# Patient Record
Sex: Female | Born: 1960 | Race: Black or African American | Hispanic: No | Marital: Married | Smoking: Never smoker
Health system: Southern US, Community
[De-identification: ages and names within clinical notes are randomized; demographics above are authoritative.]

## PROBLEM LIST (undated history)

## (undated) DIAGNOSIS — C801 Malignant (primary) neoplasm, unspecified: Secondary | ICD-10-CM

## (undated) DIAGNOSIS — E119 Type 2 diabetes mellitus without complications: Secondary | ICD-10-CM

## (undated) DIAGNOSIS — R51 Headache: Secondary | ICD-10-CM

## (undated) DIAGNOSIS — R519 Headache, unspecified: Secondary | ICD-10-CM

## (undated) DIAGNOSIS — E7800 Pure hypercholesterolemia, unspecified: Secondary | ICD-10-CM

## (undated) DIAGNOSIS — K219 Gastro-esophageal reflux disease without esophagitis: Secondary | ICD-10-CM

## (undated) DIAGNOSIS — T7840XA Allergy, unspecified, initial encounter: Secondary | ICD-10-CM

## (undated) DIAGNOSIS — T8859XA Other complications of anesthesia, initial encounter: Secondary | ICD-10-CM

## (undated) DIAGNOSIS — J45909 Unspecified asthma, uncomplicated: Secondary | ICD-10-CM

## (undated) DIAGNOSIS — R011 Cardiac murmur, unspecified: Secondary | ICD-10-CM

## (undated) DIAGNOSIS — Z8619 Personal history of other infectious and parasitic diseases: Secondary | ICD-10-CM

## (undated) HISTORY — DX: Pure hypercholesterolemia, unspecified: E78.00

## (undated) HISTORY — PX: ROTATOR CUFF REPAIR: SHX139

## (undated) HISTORY — PX: WISDOM TOOTH EXTRACTION: SHX21

## (undated) HISTORY — DX: Unspecified asthma, uncomplicated: J45.909

## (undated) HISTORY — DX: Allergy, unspecified, initial encounter: T78.40XA

## (undated) HISTORY — DX: Type 2 diabetes mellitus without complications: E11.9

## (undated) HISTORY — DX: Gastro-esophageal reflux disease without esophagitis: K21.9

---
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
## 1994-02-08 HISTORY — PX: BREAST ENHANCEMENT SURGERY: SHX7

## 1994-02-08 HISTORY — PX: AUGMENTATION MAMMAPLASTY: SUR837

## 1995-02-09 HISTORY — PX: BREAST EXCISIONAL BIOPSY: SUR124

## 2001-02-08 HISTORY — PX: BREAST CYST ASPIRATION: SHX578

## 2005-02-08 HISTORY — PX: OTHER SURGICAL HISTORY: SHX169

## 2006-02-08 HISTORY — PX: TONSILLECTOMY AND ADENOIDECTOMY: SUR1326

## 2007-02-09 HISTORY — PX: ABDOMINAL HYSTERECTOMY: SHX81

## 2007-02-09 HISTORY — PX: OVARIAN CYST REMOVAL: SHX89

## 2009-11-03 ENCOUNTER — Ambulatory Visit: Payer: Self-pay | Admitting: Family Medicine

## 2009-11-14 ENCOUNTER — Ambulatory Visit: Payer: Self-pay | Admitting: General Practice

## 2010-02-27 ENCOUNTER — Emergency Department: Payer: Self-pay | Admitting: Unknown Physician Specialty

## 2011-01-11 ENCOUNTER — Ambulatory Visit: Payer: Self-pay | Admitting: Family Medicine

## 2011-05-27 ENCOUNTER — Other Ambulatory Visit: Payer: Self-pay | Admitting: Family Medicine

## 2011-07-23 ENCOUNTER — Other Ambulatory Visit: Payer: Self-pay | Admitting: General Practice

## 2012-07-22 ENCOUNTER — Ambulatory Visit: Payer: Self-pay | Admitting: Neurology

## 2012-12-25 ENCOUNTER — Other Ambulatory Visit: Payer: Self-pay | Admitting: Urgent Care

## 2012-12-25 LAB — COMPREHENSIVE METABOLIC PANEL
Alkaline Phosphatase: 79 U/L (ref 50–136)
Anion Gap: 8 (ref 7–16)
BUN: 11 mg/dL (ref 7–18)
Bilirubin,Total: 0.4 mg/dL (ref 0.2–1.0)
Calcium, Total: 9.2 mg/dL (ref 8.5–10.1)
EGFR (African American): >60
EGFR (Non-African Amer.): >60
Sodium: 137 mmol/L (ref 136–145)

## 2012-12-25 LAB — CBC WITH DIFFERENTIAL/PLATELET
Basophil #: 0.1 10*3/uL (ref 0.0–0.1)
Eosinophil #: 0.1 10*3/uL (ref 0.0–0.7)
Eosinophil %: 1.5 %
HCT: 44.6 % (ref 35.0–47.0)
HGB: 15.4 g/dL (ref 12.0–16.0)
Lymphocyte %: 22.8 %
MCHC: 34.4 g/dL (ref 32.0–36.0)
Monocyte #: 0.6 x10 3/mm (ref 0.2–0.9)
Neutrophil #: 6.8 10*3/uL — ABNORMAL HIGH (ref 1.4–6.5)
Platelet: 513 10*3/uL — ABNORMAL HIGH (ref 150–440)
RBC: 4.9 10*6/uL (ref 3.80–5.20)
WBC: 10 10*3/uL (ref 3.6–11.0)

## 2012-12-28 ENCOUNTER — Ambulatory Visit: Payer: Self-pay | Admitting: Gastroenterology

## 2012-12-29 LAB — PATHOLOGY REPORT

## 2013-01-17 ENCOUNTER — Ambulatory Visit: Payer: Self-pay | Admitting: Family Medicine

## 2013-02-13 ENCOUNTER — Other Ambulatory Visit: Payer: Self-pay | Admitting: Urgent Care

## 2013-02-13 LAB — HEPATIC FUNCTION PANEL A (ARMC)
Albumin: 3.5 g/dL (ref 3.4–5.0)
Alkaline Phosphatase: 64 U/L
Bilirubin, Direct: 0.1 mg/dL (ref 0.00–0.20)
Bilirubin,Total: 0.2 mg/dL (ref 0.2–1.0)
SGOT(AST): 30 U/L (ref 15–37)
SGPT (ALT): 39 U/L (ref 12–78)
TOTAL PROTEIN: 7.3 g/dL (ref 6.4–8.2)

## 2013-03-06 ENCOUNTER — Ambulatory Visit: Payer: Self-pay | Admitting: Gastroenterology

## 2013-05-03 ENCOUNTER — Ambulatory Visit: Payer: Self-pay | Admitting: Specialist

## 2013-05-03 LAB — CBC WITH DIFFERENTIAL/PLATELET
BASOS ABS: 0.1 10*3/uL (ref 0.0–0.1)
Basophil %: 0.6 %
EOS ABS: 0.1 10*3/uL (ref 0.0–0.7)
EOS PCT: 1.4 %
HCT: 42.8 % (ref 35.0–47.0)
HGB: 14.6 g/dL (ref 12.0–16.0)
LYMPHS ABS: 2.6 10*3/uL (ref 1.0–3.6)
LYMPHS PCT: 25.9 %
MCH: 31.4 pg (ref 26.0–34.0)
MCHC: 34.1 g/dL (ref 32.0–36.0)
MCV: 92 fL (ref 80–100)
Monocyte #: 0.7 x10 3/mm (ref 0.2–0.9)
Monocyte %: 7.3 %
NEUTROS ABS: 6.6 10*3/uL — AB (ref 1.4–6.5)
Neutrophil %: 64.8 %
Platelet: 434 10*3/uL (ref 150–440)
RBC: 4.64 10*6/uL (ref 3.80–5.20)
RDW: 13.2 % (ref 11.5–14.5)
WBC: 10.1 10*3/uL (ref 3.6–11.0)

## 2013-07-03 DIAGNOSIS — J45909 Unspecified asthma, uncomplicated: Secondary | ICD-10-CM | POA: Insufficient documentation

## 2013-07-27 ENCOUNTER — Inpatient Hospital Stay: Payer: Self-pay | Admitting: Family Medicine

## 2013-07-27 LAB — CBC
HCT: 45.3 % (ref 35.0–47.0)
HGB: 14.9 g/dL (ref 12.0–16.0)
MCH: 30.8 pg (ref 26.0–34.0)
MCHC: 32.8 g/dL (ref 32.0–36.0)
MCV: 94 fL (ref 80–100)
Platelet: 393 10*3/uL (ref 150–440)
RBC: 4.84 10*6/uL (ref 3.80–5.20)
RDW: 12.5 % (ref 11.5–14.5)
WBC: 8 10*3/uL (ref 3.6–11.0)

## 2013-07-27 LAB — COMPREHENSIVE METABOLIC PANEL
ALK PHOS: 114 U/L
ALT: 55 U/L (ref 12–78)
ANION GAP: 13 (ref 7–16)
AST: 71 U/L — AB (ref 15–37)
Albumin: 3.7 g/dL (ref 3.4–5.0)
BUN: 9 mg/dL (ref 7–18)
Bilirubin,Total: 0.3 mg/dL (ref 0.2–1.0)
Calcium, Total: 9.2 mg/dL (ref 8.5–10.1)
Chloride: 95 mmol/L — ABNORMAL LOW (ref 98–107)
Co2: 20 mmol/L — ABNORMAL LOW (ref 21–32)
Creatinine: 1.03 mg/dL (ref 0.60–1.30)
EGFR (African American): >60
EGFR (Non-African Amer.): >60
GLUCOSE: 589 mg/dL — AB (ref 65–99)
Potassium: 3.7 mmol/L (ref 3.5–5.1)
Sodium: 128 mmol/L — ABNORMAL LOW (ref 136–145)
Total Protein: 7.5 g/dL (ref 6.4–8.2)

## 2013-07-27 LAB — URINALYSIS, COMPLETE
BILIRUBIN, UR: NEGATIVE
Bacteria: NONE SEEN
Glucose,UR: >=500 mg/dL (ref 0–75)
LEUKOCYTE ESTERASE: NEGATIVE
Nitrite: NEGATIVE
PROTEIN: NEGATIVE
Ph: 5 (ref 4.5–8.0)
RBC,UR: 2 /HPF (ref 0–5)
Specific Gravity: 1.031 (ref 1.003–1.030)
Squamous Epithelial: 1
WBC UR: 1 /HPF (ref 0–5)

## 2013-07-27 LAB — HEMOGLOBIN A1C: Hemoglobin A1C: 11.8 % — ABNORMAL HIGH (ref 4.2–6.3)

## 2013-07-27 LAB — LIPASE, BLOOD: Lipase: 147 U/L (ref 73–393)

## 2013-07-28 LAB — BASIC METABOLIC PANEL
Anion Gap: 8 (ref 7–16)
BUN: 5 mg/dL — ABNORMAL LOW (ref 7–18)
CREATININE: 0.58 mg/dL — AB (ref 0.60–1.30)
Calcium, Total: 7.7 mg/dL — ABNORMAL LOW (ref 8.5–10.1)
Chloride: 108 mmol/L — ABNORMAL HIGH (ref 98–107)
Co2: 21 mmol/L (ref 21–32)
EGFR (African American): >60
Glucose: 133 mg/dL — ABNORMAL HIGH (ref 65–99)
OSMOLALITY: 273 (ref 275–301)
Potassium: 3.1 mmol/L — ABNORMAL LOW (ref 3.5–5.1)
SODIUM: 137 mmol/L (ref 136–145)

## 2013-07-29 LAB — BASIC METABOLIC PANEL
ANION GAP: 11 (ref 7–16)
BUN: 6 mg/dL — AB (ref 7–18)
CO2: 21 mmol/L (ref 21–32)
CREATININE: 0.65 mg/dL (ref 0.60–1.30)
Calcium, Total: 8.7 mg/dL (ref 8.5–10.1)
Chloride: 105 mmol/L (ref 98–107)
EGFR (Non-African Amer.): >60
Glucose: 262 mg/dL — ABNORMAL HIGH (ref 65–99)
Osmolality: 281 (ref 275–301)
Potassium: 4.5 mmol/L (ref 3.5–5.1)
Sodium: 137 mmol/L (ref 136–145)

## 2013-07-30 LAB — BASIC METABOLIC PANEL
Anion Gap: 9 (ref 7–16)
BUN: 8 mg/dL (ref 7–18)
CO2: 21 mmol/L (ref 21–32)
CREATININE: 0.64 mg/dL (ref 0.60–1.30)
Calcium, Total: 9.5 mg/dL (ref 8.5–10.1)
Chloride: 105 mmol/L (ref 98–107)
GLUCOSE: 247 mg/dL — AB (ref 65–99)
OSMOLALITY: 277 (ref 275–301)
POTASSIUM: 3.9 mmol/L (ref 3.5–5.1)
SODIUM: 135 mmol/L — AB (ref 136–145)

## 2013-08-03 ENCOUNTER — Ambulatory Visit: Payer: Self-pay | Admitting: Family Medicine

## 2013-08-03 DIAGNOSIS — E119 Type 2 diabetes mellitus without complications: Secondary | ICD-10-CM | POA: Insufficient documentation

## 2013-08-03 DIAGNOSIS — G43909 Migraine, unspecified, not intractable, without status migrainosus: Secondary | ICD-10-CM | POA: Insufficient documentation

## 2013-08-08 ENCOUNTER — Ambulatory Visit: Payer: Self-pay | Admitting: Family Medicine

## 2013-09-08 ENCOUNTER — Ambulatory Visit: Payer: Self-pay | Admitting: Family Medicine

## 2013-12-25 DIAGNOSIS — K219 Gastro-esophageal reflux disease without esophagitis: Secondary | ICD-10-CM | POA: Insufficient documentation

## 2014-03-19 ENCOUNTER — Ambulatory Visit: Payer: Self-pay | Admitting: Gastroenterology

## 2014-04-15 ENCOUNTER — Emergency Department: Payer: Self-pay | Admitting: Emergency Medicine

## 2014-05-22 ENCOUNTER — Ambulatory Visit: Payer: Self-pay

## 2014-06-01 NOTE — H&P (Signed)
PATIENT NAME:  Virginia Rogers, Virginia Rogers MR#:  [id number] DATE OF BIRTH:  November 08, 1960  DATE OF ADMISSION:  07/27/2013  PRIMARY CARE PHYSICIAN: Dr. Netty Starring.   CHIEF COMPLAINT: Elevated blood sugar.   HISTORY OF PRESENT ILLNESS: This is a very pleasant 54 year old female who actually works at [employer], who presented to her primary care physician's office today complaining of polyuria, polydipsia, weight loss, and leg cramps. She went in for some routine labs and they did obviously a BMP and glucose. She got a call this evening saying that she had a critical value of glucose greater than 400. Sure enough when she arrived in the ER, her sugar/blood glucose was greater than 600. She was given only 6 units of insulin and 2 liters of fluids. Her blood sugars remain about 589. Over the past few months, the patient has had weight loss. She has been having polyuria, polydipsia. She thought this was related to the hot weather.   REVIEW OF SYSTEMS:   CONSTITUTIONAL: No fever, fatigue, weakness.  EYES: No blurred or double vision. No glaucoma or cataracts.  EARS, NOSE, THROAT: No ear pain, seasonal allergies, snoring, postnasal drip.  RESPIRATORY: No cough, wheezing, hemoptysis, COPD.  CARDIOVASCULAR: No chest pain. No palpitations, syncope, edema, arrhythmia, dyspnea on exertion.  GASTROINTESTINAL: No nausea, vomiting, diarrhea. She occasionally has some abdominal cramping in the right lower quadrant. No melena or ulcers.  GENITOURINARY: No dysuria or hematuria.  ENDOCRINE: Positive polyuria, polydipsia, polyphagia. HEMATOLOGIC AND LYMPHATIC: No anemia or easy bruising.  SKIN: No rashes or lesions.  MUSCULOSKELETAL: No limited activity.  NEUROLOGIC: No history of CVA, TIA, or seizures.  PSYCHIATRIC: No history of anxiety or depression.   PAST MEDICAL HISTORY: Migraines, also asthma.  MEDICATIONS:  1.  Imitrex p.r.n.  2.  Birth control pills.  ALLERGIES:  1.  PENICILLIN AND VANCOMYCIN CAUSE  ANAPHYLAXIS.  2.  LOBSTER.  SOCIAL HISTORY: No tobacco, alcohol, or IV drug use. She lives with her husband.   PAST SURGICAL HISTORY: 1.  Hysterectomy.  2.  Breast lumpectomy, subsequently had bilateral breast implants.  3.  Bilateral rotator cuff.   FAMILY HISTORY: Positive for diabetes and CAD at a young age.   PHYSICAL EXAMINATION:  VITAL SIGNS: Temperature 98.3, pulse 95, respirations 19, blood pressure 180/87, 97% on room air.  GENERAL: The patient is alert and oriented, not in acute distress. HEENT: Head is atraumatic. Pupils are round and reactive. Sclerae anicteric. Mucous membranes are moist. Oropharynx is clear.  NECK: Supple without JVD, carotid bruit, or enlarged thyroid.  CARDIOVASCULAR: Regular rate and rhythm. No murmur, gallops, or rubs. PMI is not displaced.  LUNGS: Clear to auscultation without crackles, rales, rhonchi, or wheezing. Normal to percussion.  ABDOMEN: Bowel sounds are positive. Nontender, nondistended. No hepatosplenomegaly. EXTREMITIES: No clubbing, cyanosis, or edema.  NEUROLOGIC: Cranial nerves II through XII are intact. No focal deficits. SKIN: Without rashes or lesions. MUSCULOSKELETAL: The patient is able to move all extremities, 5 out of 5 strength.   LABORATORY DATA: White blood cells 8, hemoglobin 15, hematocrit 45.3, platelets of 393. Lipase 147. Sodium 128, potassium 3.7, chloride 95, bicarbonate 20, BUN 9, creatinine 1.03,  glucose 589, calcium 9.2, bilirubin 0.3, alkaline phosphatase 114, ALT 55, AST 71, total protein 7.5, albumin 3.7, anion gap of 13. Urinalysis is negative for LCE or nitrites. PCO2 is 33.   ASSESSMENT AND PLAN: This is a very pleasant 54 year old female with history of migraines, asthma, who presents with new-onset diabetes.  1.  Diabetes, not diabetic  ketoacidosis: The patient will be admitted to stepdown with a non-diabetic ketoacidosis hyperglycemic protocol. We will provide ample amount of fluids, as secondary to her  elevated blood sugars. Check a hemoglobin A1c. The patient will need outpatient diabetes education, which can be planned for Monday. Continue to monitor BMPs and blood sugars.  2.  Hyponatremia secondary to her hyperglycemia: This corrects with her elevated blood sugar. Will follow.  3.  Accelerated blood pressure: Likely secondary to anxiety. The patient says that she has no history of high blood pressure. We will continue to follow while in the hospital. No medications at this time.  4.  The patient is full code status.   TIME SPENT: Approximately 45 minutes.   ____________________________ Donell Beers. Benjie Karvonen, MD spm:jcm D: 07/27/2013 20:25:56 ET T: 07/27/2013 21:27:01 ET JOB#: [id number]  cc: Sital P. Benjie Karvonen, MD, <Dictator> Dion Body, MD Donell Beers MODY MD ELECTRONICALLY SIGNED 07/28/2013 23:30

## 2014-06-01 NOTE — Discharge Summary (Signed)
PATIENT NAME:  Virginia Rogers, Virginia Rogers MR#:  [id number] DATE OF BIRTH:  1961-01-06  DATE OF ADMISSION:  07/27/2013 DATE OF DISCHARGE:    DISCHARGE DIAGNOSES: 1.  Adult-onset diabetes that is new.  2.  Migraines.    DISCHARGE MEDICATIONS: 1.  Imitrex 100 mg p.o. p.r.n. up to 2 tabs four hours apart as needed for migraines.  2.  Nasonex 50 mcg once per each nostril daily as needed.  3.  Nexium 40 mg p.o. daily. 4.  Metformin 500 mg p.o. b.i.d. to be taken with meals. 5.  Glimepiride 2 mg p.o. b.i.d. to be taken with meals. 6.  Aspirin 81 mg p.o. daily.  7.  Levonorgestrel ethinyl estradiol tablet 0.1/20, 1 tab daily as directed.    CONSULTS: None.   PROCEDURES: None.  PERTINENT LABS: On day of discharge, sodium 135, potassium 3.9, creatinine 0.64, glucose 247. A1c of 11.8%. C-peptide is still pending.   BRIEF HOSPITAL COURSE: The patient initially came in with elevated CBGs, hyperglycemia without diabetic ketoacidosis, new onset diabetes with A1c 11.8%. She was initially placed on an insulin drip and was titrated off promptly. She was switched over to oral metformin and glimepiride with goal to keep her off of insulin at this time. C-peptide was ordered but still pending. Based on that level and based on her CBGs, we will determine at later time whether or not to add insulin to her regimen. She was educated from a lifestyle modification standpoint. She is to check her CBGs once a day fasting, follow up with Dr. Netty Starring in one week in the clinic.   ____________________________ Dion Body, MD kl:cg D: 07/30/2013 07:40:51 ET T: 07/30/2013 07:47:26 ET JOB#: [id number]  cc: Dion Body, MD, <Dictator> Dion Body MD ELECTRONICALLY SIGNED 08/07/2013 11:33

## 2014-06-03 ENCOUNTER — Other Ambulatory Visit: Payer: Self-pay

## 2014-06-03 LAB — SURGICAL PATHOLOGY

## 2014-06-03 NOTE — Patient Outreach (Signed)
[name]  06/03/2014   Virginia Rogers September 09, 1960 [id number]   Objective: Blood sugar meter and tracking notes show excellent fasting blood sugars of 90-148m/dl in early March but now fasting blood sugars of 130's since starting Symbicort for asthma flare. She is done the Symbicort now.   Her own dog bit her finger - had to take antibiotics and her blood sugars were lower at that time.  Current Medications:  Current Outpatient Prescriptions  Medication Sig Dispense Refill  . EPINEPHrine 1 MG/ML KIT Inject 0.3 mg as directed as needed. Take 0.370monce a day as needed    . esomeprazole (NEXIUM) 40 MG capsule Take 20 mg by mouth every morning.     . fexofenadine (ALLEGRA) 180 MG tablet Take 180 mg by mouth daily.    . Marland Kitchenlimepiride (AMARYL) 1 MG tablet Take 1 mg by mouth 2 (two) times daily.    . SUMAtriptan (IMITREX) 100 MG tablet Take 100 mg by mouth as needed. Repeat after 4 hours if needed, no more than 2 tabs per 24 hours     No current facility-administered medications for this visit.     Fall/Depression Screening: PHQ 2/9 Scores 06/03/2014  PHQ - 2 Score 0    Assessment: Excellent grasp of diabetes knowledge- eating meals as instructed in diabetes classes. Recent flare with asthma has caused sugars to be elevated but had been doing well previous to that.   [facility] Care Plan Problem One        Patient Outreach from 06/03/2014 in [place]  Patient will increase exercise as tolerated to avoid asthma exacerbations and achive exercise 5x/week for 30 minutes by august 2016   CaHallsvilleor Problem One  Active   [facility] Goal Start Date  06/03/14   Interventions for Problem One Long Term Goal  Ride tricycle or use WII if weather is too hot, rainy or asthma symptoms are present      Follow up with Dr. FlRaul Deln July, 2016, Dr. LiHassan Rowanugust 2016   JuGentry FitzRN, BAPhilippiMHBancroft CDMadison [phone]:  [phone]-mail: [email or web address] [address]Bu[address]amplico[place]  [id number]

## 2014-07-24 ENCOUNTER — Telehealth: Payer: Self-pay

## 2014-07-30 ENCOUNTER — Telehealth: Payer: Self-pay

## 2014-08-19 ENCOUNTER — Other Ambulatory Visit: Payer: Self-pay | Admitting: Physician Assistant

## 2014-08-19 ENCOUNTER — Telehealth: Payer: Self-pay

## 2014-08-19 DIAGNOSIS — N643 Galactorrhea not associated with childbirth: Secondary | ICD-10-CM

## 2014-08-20 ENCOUNTER — Other Ambulatory Visit: Payer: Self-pay | Admitting: Physician Assistant

## 2014-08-20 DIAGNOSIS — N643 Galactorrhea not associated with childbirth: Secondary | ICD-10-CM

## 2014-08-23 ENCOUNTER — Ambulatory Visit
Admission: RE | Admit: 2014-08-23 | Discharge: 2014-08-23 | Disposition: A | Discharge status: Home / Self Care | Payer: 59 | Source: Ambulatory Visit | Attending: Physician Assistant | Admitting: Physician Assistant

## 2014-08-23 ENCOUNTER — Ambulatory Visit: Payer: 59

## 2014-08-23 ENCOUNTER — Other Ambulatory Visit: Payer: Self-pay | Admitting: Physician Assistant

## 2014-08-23 DIAGNOSIS — N643 Galactorrhea not associated with childbirth: Secondary | ICD-10-CM

## 2014-08-23 DIAGNOSIS — Z9882 Breast implant status: Secondary | ICD-10-CM | POA: Diagnosis not present

## 2014-08-23 DIAGNOSIS — N6002 Solitary cyst of left breast: Secondary | ICD-10-CM | POA: Diagnosis not present

## 2014-08-23 HISTORY — DX: Malignant (primary) neoplasm, unspecified: C80.1

## 2014-09-25 ENCOUNTER — Other Ambulatory Visit: Payer: Self-pay

## 2014-09-25 NOTE — Patient Outreach (Signed)
[facility]) Care Management  09/25/2014  Virginia Rogers Feb 04, 1961 [id number]   Left message for patient to call me.  Ran into her a couple of weeks ago and she was very concerned about high blood sugars- told me she'd try to get into see her MD and then get back with me but I haven't heard from her.    [name], RN, BA, [place], [place] Direct Dial:  [phone]  Fax:  [phone] E-mail: [email or web address] [address] [place], [place]  [address]

## 2014-09-27 ENCOUNTER — Other Ambulatory Visit: Payer: Self-pay

## 2014-09-27 NOTE — Patient Outreach (Signed)
[facility]) Care Management  09/27/2014  Anina Schnake 07/01/1960 [id number]   Spoke to [place] by phone regarding recent high blood sugars.  She spoke to Dr. Netty Starring who started her on Lantus 10 units daily- she continues on Glimiperide 4mg  bid as well.  Fasting blood sugars have improved but are still 167- 240mg /dl.   I have suggested she try and have a bedtime snack containing a protein and carbohydrate to see if we can get the fasting blood sugars to improve.  Patient eats supper around 6pm and goes to bed near midnight and has previously not eaten a snack.  She will call me in a week.   [name], RN, BA, [place], [place] Direct Dial:  [phone]  Fax:  [phone] E-mail: [email or web address] [address], [place], [place]  [address]

## 2014-10-02 ENCOUNTER — Other Ambulatory Visit: Payer: Self-pay

## 2014-10-07 NOTE — Patient Outreach (Signed)
[place] [facility]) Care Management  10/07/2014  Wendy Hoback 01-Jan-1961 [id number]  Ran into [place] at a hospital wide screening.  She reports that since she has added a bedtime snack to her evenings, blood sugars have dropped to the 150's fasting each day.  She continues on diabetes medications as ordered.   [name], RN, BA, [place], [place] Direct Dial:  [phone]  Fax:  [phone] E-mail: [email or web address] [address] [place], [place]  [address]

## 2014-11-15 ENCOUNTER — Telehealth: Payer: Self-pay

## 2014-11-21 ENCOUNTER — Telehealth: Payer: Self-pay

## 2014-11-22 ENCOUNTER — Encounter: Payer: Self-pay | Admitting: Physician Assistant

## 2014-11-22 ENCOUNTER — Ambulatory Visit: Payer: Self-pay | Admitting: Physician Assistant

## 2014-11-22 VITALS — BP 142/80 | Temp 99.0°F

## 2014-11-22 DIAGNOSIS — R05 Cough: Secondary | ICD-10-CM

## 2014-11-22 DIAGNOSIS — R059 Cough, unspecified: Secondary | ICD-10-CM

## 2014-11-22 MED ORDER — HYDROCOD POLST-CPM POLST ER 10-8 MG/5ML PO SUER: 5.0000 mL | Freq: Two times a day (BID) | ORAL | Status: DC

## 2014-11-22 MED ORDER — BENZONATATE 200 MG PO CAPS: 200.0000 mg | ORAL_CAPSULE | Freq: Two times a day (BID) | ORAL | Status: DC | PRN

## 2014-11-22 NOTE — Progress Notes (Signed)
   Subjective:    Patient ID: Wakeelah Solan, female    DOB: 21-Jun-1960, 54 y.o.   MRN: [id number]  HPI Patient c/o two week of intermitting productive/non-productive cough.  Seen last week and give Z-pack and Prednisone. Cough has persist and worsen.      Review of Systems Denies fever/chill.  Intermitting vomiting 2nd to prolong cough episode.     Objective:   Physical Exam Appears malaise. HEENT unremarkable, except for prolong cough.  Neck supple, Lungs CTA, and Heart RRR.       Assessment & Plan:  Bronchospasms.  Tessalon and Tussionex as directed.  Follow up in 3 days.

## 2014-12-04 ENCOUNTER — Telehealth: Payer: Self-pay

## 2015-01-08 ENCOUNTER — Other Ambulatory Visit: Payer: Self-pay

## 2015-01-08 NOTE — Patient Outreach (Signed)
[place] [facility] [facility]) Care Management  01/08/2015  Virginia Rogers 1961-01-06 [id number]  Spoke to patient by phone.  Her blood sugars have gone from 270-290mg /dl in the morning to 130-142mg /dl fasting but she's having blood sugars 79-80mg /dl around 3:30pm.  She is eating lunch.  She is not shaky when this is happening but she is developing a headache. She is taking Invokana 100mg /day and Glimiperide 2mg  qam and 2mg  qpm.  She is going to try and take 1/2 tab of Glimiperide in the am and assess her blood sugar and headaches to see if they improve.  She will follow up with me in 3 weeks.    Virginia Fitz, RN, BA, [place], [place] Direct Dial:  [phone]  Fax:  [phone] E-mail: [email or web address] [address], [place], [place]  [address]

## 2015-01-20 ENCOUNTER — Ambulatory Visit: Payer: Self-pay

## 2015-01-27 ENCOUNTER — Other Ambulatory Visit: Payer: Self-pay

## 2015-01-27 VITALS — BP 130/84 | Ht 65.0 in | Wt 226.0 lb

## 2015-01-27 DIAGNOSIS — E119 Type 2 diabetes mellitus without complications: Secondary | ICD-10-CM | POA: Insufficient documentation

## 2015-01-27 NOTE — Patient Outreach (Signed)
[place] [facility]) Care Management  [place]  01/27/2015   Verma Grothaus 1961/01/30 [id number]  Subjective: Izzah was in for her Link to Wellness visit- she is actively working with her MD to managing her blood sugars since she has been on numerous asthma medications and steroids this fall. She currently has well controlled blood sugars and is looking forward to a repeat A1C in February when she sees her MD.  Started exercising Saturday, January 25, 2015 because her asthma was controlled.  Yesterday was too hot and hshe broke out with a heat rash and was unable to exercise. Will resume tonight.   Objective:  Filed Vitals:   01/27/15 1542  BP: 130/84  Height: 1.651 m (_0 )  Weight: 226 lb (102.513 kg)     Current Medications:  Current Outpatient Prescriptions  Medication Sig Dispense Refill  . benzonatate (TESSALON) 200 MG capsule Take 1 capsule (200 mg total) by mouth 2 (two) times daily as needed for cough. 20 capsule 0  . budesonide-formoterol (SYMBICORT) 160-4.5 MCG/ACT inhaler Inhale 2 puffs into the lungs daily after breakfast.    . EPINEPHrine 1 MG/ML KIT Inject 0.3 mg as directed as needed. Take 0.38m once a day as needed    . esomeprazole (NEXIUM) 40 MG capsule Take 20 mg by mouth every other day.     .Marland Kitchenglimepiride (AMARYL) 1 MG tablet Take 4 mg by mouth 2 (two) times daily.     .Marland Kitchenloratadine (CLARITIN) 10 MG tablet Take 10 mg by mouth daily.    .Marland Kitchenlosartan (COZAAR) 25 MG tablet Take 25 mg by mouth at bedtime.    . SUMAtriptan (IMITREX) 100 MG tablet Take 100 mg by mouth as needed. Repeat after 4 hours if needed, no more than 2 tabs per 24 hours    . chlorpheniramine-HYDROcodone (TUSSIONEX PENNKINETIC ER) 10-8 MG/5ML SUER Take 5 mLs by mouth 2 (two) times daily. (Patient not taking: Reported on 01/27/2015) 115 mL 0  . fexofenadine (ALLEGRA) 180 MG tablet Take 180 mg by mouth daily. Reported on 01/27/2015     No current facility-administered medications  for this visit.    Functional Status:  No flowsheet data found.  Fall/Depression Screening: PHQ 2/9 Scores 01/27/2015 06/03/2014  PHQ - 2 Score 1 0    Assessment: Engaged employee; taking meds as ordered.  Checks sugars 2 x per day - BG 130's in the am, 150's in the pm.  Average sugar over 14 days is 1568mdl.   Eats 3 times per day.  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  She will continue to check her sugars bid and take meds as ordered.  MD, dental, dilated eye all scheduled for the new year.  [facility] CenterM Care Plan Problem One        Most Recent Value   Care Plan Problem One  Patient will increase exercise as tolerated to avoid asthma exacerbations and achive exercise 5x/week for 30 minutes by august 2016   Role Documenting the Problem One  Care Management CoDillonor Problem One  Active   THN Long Term Goal (31-90 days)  Patient will exercise as tolerated [Patient will increase exercise as tolerated to avoid asthma ]   THN Long Term Goal Start Date  01/27/15   Interventions for Problem One Long Term Goal  Wii or tricycle as weather permits  Follow up by phone in 1 month.  [name], RN, BA, MHA, CDE Diabetes Coordinator Inpatient Diabetes Program  [phone] (Team Pager) [phone] ([place]) 01/27/2015 3:47 PM

## 2015-02-14 DIAGNOSIS — H5213 Myopia, bilateral: Secondary | ICD-10-CM | POA: Diagnosis not present

## 2015-03-07 ENCOUNTER — Encounter: Payer: Self-pay | Admitting: Physician Assistant

## 2015-03-07 ENCOUNTER — Ambulatory Visit: Payer: Self-pay | Admitting: Physician Assistant

## 2015-03-07 VITALS — BP 132/90 | Temp 98.6°F

## 2015-03-07 DIAGNOSIS — R05 Cough: Secondary | ICD-10-CM

## 2015-03-07 DIAGNOSIS — R059 Cough, unspecified: Secondary | ICD-10-CM

## 2015-03-07 MED ORDER — LEVOFLOXACIN 500 MG PO TABS: 500.0000 mg | ORAL_TABLET | Freq: Every day | ORAL | Status: DC

## 2015-03-07 MED ORDER — METHYLPREDNISOLONE 4 MG PO TBPK: ORAL_TABLET | ORAL | Status: AC

## 2015-03-07 MED ORDER — BENZONATATE 200 MG PO CAPS: 200.0000 mg | ORAL_CAPSULE | Freq: Three times a day (TID) | ORAL | Status: DC | PRN

## 2015-03-07 NOTE — Progress Notes (Signed)
S: c/o cough and congestion, cough for about a week, drainage started last 2 days, is brownish/yellow, denies fever/chills/cp/sob, cough is barking type cough which is typical for patient, hx of asthma, is very sensitive to fragrances and cigarette smoke  O: vitals wnl, nad, cough is deep and barking, tms clear, nasal mucosa red, throat injected , neck supple no lymph, lungs c t a, cv rrr  A: acute uri  P: levaquin 500mg  qd x 10d, medrol dose pack, tessalon perls, return if worsening, pt doesn't want svn tx today

## 2015-03-17 DIAGNOSIS — E119 Type 2 diabetes mellitus without complications: Secondary | ICD-10-CM | POA: Diagnosis not present

## 2015-03-18 ENCOUNTER — Other Ambulatory Visit: Payer: Self-pay

## 2015-03-18 NOTE — Patient Outreach (Signed)
[facility]) Care Management  03/18/2015  Elayne Tolsma 1960/04/14 [id number]  Spoke to Virginia Rogers by phone today.  She reports having a few great weeks. Fasting blood sugars over the past 12 days have been 112-119mg /dl until yesterday when she had to use her nebulizers during the day and into the evening and woke with a fasting blood sugar today of 146mg /dl.  She had her A1C drawn yesterday and is awaiting the results.   She is taking Glimiperide 4mg  with supper and Invokana 100mg  /day.  She dis require antibiotics and nebulizers and steroids last month and at that time took an extra 100mg  Invokana during that period of time.   Evening blood sugars at that time were 175-210mg /dl but were ideal when she woke.  She's been feeling so well she was able to exercise for the past 5 days on the WII or on her stationary bike for 45 minutes.She will contact me when she has her A1C.   [name], RN, BA, [place], [place] Direct Dial:  [phone]  Fax:  [phone] E-mail: [email or web address] [address], [place], [place]  [address]

## 2015-03-25 DIAGNOSIS — E119 Type 2 diabetes mellitus without complications: Secondary | ICD-10-CM | POA: Diagnosis not present

## 2015-04-02 ENCOUNTER — Telehealth: Payer: Self-pay

## 2015-05-07 ENCOUNTER — Other Ambulatory Visit: Payer: Self-pay | Admitting: Pharmacist

## 2015-05-09 NOTE — Patient Outreach (Signed)
[place] [facility]) Care Management  [place]   05/09/2015  Michela Herst 1960-07-11 [id number]  Subjective: Mrs. Jensen is a 55 year old female here today for her annual Link To Wellness (LTW) pharmacist visit. She will continue to see the LTW nurse for diabetes management every 6 months.  She does not have any complaints today. In January, she had an asthma flare treated with steroids, and has been using Invokana 1-2 times daily due to increased blood glucose.  She is current with her dental and vision exams. She is due for her annual exam in June and will have her cholesterol panel repeated at that time.  Objective:  Weight: 222 pounds BP: 140/99 mmHg A1C: 03/17/15: 7.4% Cholesterol: 12/09/14: TC = 195 mg/dl, TG = 138 mg/dl, HDL = 45.7 mg/dl, LDL = 122 mg/dl  Current Medications: Current Outpatient Prescriptions  Medication Sig Dispense Refill  . budesonide-formoterol (SYMBICORT) 160-4.5 MCG/ACT inhaler Inhale 2 puffs into the lungs daily after breakfast.    . Canagliflozin (INVOKANA PO) Take 1 tablet by mouth daily.     Marland Kitchen EPINEPHrine 1 MG/ML KIT Inject 0.3 mg as directed as needed. Take 0.44m once a day as needed    . fexofenadine (ALLEGRA) 180 MG tablet Take 180 mg by mouth daily. Reported on 01/27/2015    . glimepiride (AMARYL) 1 MG tablet Take 4 mg by mouth 2 (two) times daily.     .Marland Kitchenloratadine (CLARITIN) 10 MG tablet Take 10 mg by mouth daily.    .Marland Kitchenlosartan (COZAAR) 25 MG tablet Take 25 mg by mouth at bedtime. Reported on 03/07/2015    . prochlorperazine (COMPAZINE) 10 MG tablet Take 10 mg by mouth every 6 (six) hours as needed for nausea or vomiting (with migraines).    . SUMAtriptan (IMITREX) 100 MG tablet Take 100 mg by mouth as needed. Repeat after 4 hours if needed, no more than 2 tabs per 24 hours    . benzonatate (TESSALON) 200 MG capsule Take 1 capsule (200 mg total) by mouth 2 (two) times daily as needed for cough. (Patient not taking: Reported on  03/07/2015) 20 capsule 0  . benzonatate (TESSALON) 200 MG capsule Take 1 capsule (200 mg total) by mouth 3 (three) times daily as needed for cough. (Patient not taking: Reported on 05/07/2015) 30 capsule 0  . chlorpheniramine-HYDROcodone (TUSSIONEX PENNKINETIC ER) 10-8 MG/5ML SUER Take 5 mLs by mouth 2 (two) times daily. (Patient not taking: Reported on 01/27/2015) 115 mL 0  . esomeprazole (NEXIUM) 40 MG capsule Take 20 mg by mouth every other day. Reported on 05/07/2015    . levofloxacin (LEVAQUIN) 500 MG tablet Take 1 tablet (500 mg total) by mouth daily. (Patient not taking: Reported on 05/07/2015) 10 tablet 0   No current facility-administered medications for this visit.    Functional Status: In your present state of health, do you have any difficulty performing the following activities: 05/09/2015  Hearing? N  Vision? N  Difficulty concentrating or making decisions? N  Walking or climbing stairs? N  Dressing or bathing? N  Doing errands, shopping? N    Fall/Depression Screening: PHQ 2/9 Scores 05/07/2015 01/27/2015 06/03/2014  PHQ - 2 Score 0 1 0    THN CM Care Plan Problem One        Most Recent Value   Care Plan Problem One  Patient will increase exercise as tolerated to avoid asthma exacerbations and achive exercise 5x/week for 30 minutes by august 2016   Role Documenting  the Problem One  Clinical Pharmacist   Care Plan for Problem One  Active [As of 05/07/15,  patient did not meet the care plan set in December 2016 due to asthma exacerbation. ]   THN Long Term Goal (31-90 days)  Patient will exercise as tolerated to avoid asthma exacerbation,  5 days per week, 30 minutes per session   THN Long Term Goal Start Date  05/07/15   Interventions for Problem One Long Term Goal  Encouraged patient to continue using the Wii and practicing Yoga      Assessment: 1. Diabetes: not at goal as of less than 7%. 2. Hypertension: not at goal of less than 140/90 mmHg. Mrs. Brockwell states that her  BP is normally lower. 3. Cholesterol:  HDL not at goal of more than 50 mg/dl. LDL not at goal of less than 150 mg/dl.  Plan: 1. Diabetes: Mrs. Asmus will continue using Invokana 173m daily with the option to add a second dose in the evening. She will also increase her exercise as tolerated with asthma. 2. Hypertension: will be reassessed at next PCP visit in June 2017. 3. Cholesterol: will be reassessed at annual visit in June 2017. 4. Ms. WLarameewill follow up with the LTW nurse in 6 months. Next pharmacist visit is scheduled for 05/12/2016.  Tehila Sokolow K. HDicky Doe PharmD TFairburyManagement [phone]

## 2015-05-12 ENCOUNTER — Encounter: Payer: Self-pay | Admitting: Physician Assistant

## 2015-05-12 ENCOUNTER — Ambulatory Visit: Payer: Self-pay | Admitting: Physician Assistant

## 2015-05-12 VITALS — BP 130/92 | Temp 98.5°F

## 2015-05-12 DIAGNOSIS — J209 Acute bronchitis, unspecified: Secondary | ICD-10-CM

## 2015-05-12 DIAGNOSIS — R059 Cough, unspecified: Secondary | ICD-10-CM

## 2015-05-12 DIAGNOSIS — R05 Cough: Secondary | ICD-10-CM

## 2015-05-12 MED ORDER — LEVOFLOXACIN 500 MG PO TABS: 500.0000 mg | ORAL_TABLET | Freq: Every day | ORAL | Status: DC

## 2015-05-12 MED ORDER — PREDNISONE 10 MG (21) PO TBPK: 10.0000 mg | ORAL_TABLET | Freq: Every day | ORAL | Status: AC

## 2015-05-12 MED ORDER — HYDROCOD POLST-CPM POLST ER 10-8 MG/5ML PO SUER: 5.0000 mL | Freq: Two times a day (BID) | ORAL | Status: DC | PRN

## 2015-05-12 NOTE — Progress Notes (Signed)
S: C/o cough and congestion with wheezing and chest pain, chest is sore from coughing, denies fever, chills, mucus is green,  cough is dry and hacking; keeping pt awake at night;  denies cardiac type chest pain or sob, v/d, abd pain Remainder ros neg  O: vitals wnl, nad, tms clear, throat injected, neck supple no lymph, lungs with wheezing, cv rrr, neuro intact, cough is harsh and barking  A:  Acute bronchitis   P:  rx medication: levaquin, sterapred dose pack, tussionex, use otc meds, tylenol or motrin as needed for fever/chills, return if not better in 3 -5 days, return earlier if worsening

## 2015-06-04 ENCOUNTER — Other Ambulatory Visit: Payer: Self-pay

## 2015-06-04 NOTE — Patient Outreach (Signed)
[facility]) Care Management  06/04/2015  Virginia Rogers 1960/03/24 [id number]  I spoke to [place] by phone.  She recently had to see the PA for respiratory ailment and took prednisone for 2 days without elevation in blood sugar numbers.  Her blood sugars have been 135-140mg /dl and she generally was not having low blood sugars until this week when she decided to try Ensure plus for breakfast with an attempt for some weight loss. She drank one yesterday am and by 9am her CBG was 55mg /dl- she drank a second one.    Today she drank one and did not have a low blood sugar and CBG before lunch was 75mg /dl.   She has been trying to do some sort of exercise every day for 20 minutes.  She will be going to [place] for a few days in May - I have recommended she have prednisone with her in case she has a flare up related to changes in the weather.  I have scheduled to see her in early May- we will check her A1C at that time.   [name], RN, BA, [place], [place] Direct Dial:  [phone]  Fax:  [phone] E-mail: [email or web address] [address] [place], [place]  [address]

## 2015-06-16 ENCOUNTER — Other Ambulatory Visit: Payer: Self-pay

## 2015-06-16 VITALS — BP 110/80 | Ht 65.0 in | Wt 224.7 lb

## 2015-06-16 DIAGNOSIS — IMO0001 Reserved for inherently not codable concepts without codable children: Secondary | ICD-10-CM

## 2015-06-16 DIAGNOSIS — E1165 Type 2 diabetes mellitus with hyperglycemia: Principal | ICD-10-CM

## 2015-06-16 LAB — POCT GLYCOSYLATED HEMOGLOBIN (HGB A1C): Hemoglobin A1C: 8.2

## 2015-06-16 NOTE — Patient Outreach (Signed)
[facility]) Care Management  [place]  06/16/2015   Virginia Rogers Sep 16, 1960 [id number]  Subjective: Met with Virginia Rogers today for the Link to wellness diabetes visit- She tells me blood sugars fasting have been 135-135m/dl.  She does not check sugars at any other time.  She does not complain of hyper or hypoglycemia events although she did drop to a blood sugar in the 60's last week when she only had Ensure for breakfast.  She has not had recent flare /exacerbation of her asthma but has required 7 days of steroids since the last time I saw her. She checks her feet daily and complained of a "peeling area" at the top of her great toe of her left foot- no open area, no exudate.   Objective:  Filed Vitals:   06/16/15 1513  BP: 110/80  Height: 1.651 m (_0 )  Weight: 224 lb 11.2 oz (101.923 kg)   POCT A1C 8.2%  Encounter Medications:  Outpatient Encounter Prescriptions as of 06/16/2015  Medication Sig Note  . budesonide-formoterol (SYMBICORT) 160-4.5 MCG/ACT inhaler Inhale 2 puffs into the lungs daily after breakfast.   . Canagliflozin (INVOKANA PO) Take 100 mg by mouth daily.    .Marland KitchenEPINEPHrine 1 MG/ML KIT Inject 0.3 mg as directed as needed. Take 0.340monce a day as needed   . fexofenadine (ALLEGRA) 180 MG tablet Take 180 mg by mouth daily. Reported on 01/27/2015   . glimepiride (AMARYL) 1 MG tablet Take 2 mg by mouth 2 (two) times daily.    . Anastasio Auerbach00 MG TABS tablet 100 mg. 06/16/2015: Received Sig:   . loratadine (CLARITIN) 10 MG tablet Take 10 mg by mouth daily.   . Marland Kitchenosartan (COZAAR) 25 MG tablet Take 25 mg by mouth at bedtime. Reported on 03/07/2015   . prochlorperazine (COMPAZINE) 10 MG tablet Take 10 mg by mouth every 6 (six) hours as needed for nausea or vomiting (with migraines).   . SUMAtriptan (IMITREX) 100 MG tablet Take 100 mg by mouth as needed. Repeat after 4 hours if needed, no more than 2 tabs per 24 hours   . benzonatate (TESSALON) 200 MG capsule  Take 1 capsule (200 mg total) by mouth 2 (two) times daily as needed for cough. (Patient not taking: Reported on 06/16/2015)   . benzonatate (TESSALON) 200 MG capsule Take 1 capsule (200 mg total) by mouth 3 (three) times daily as needed for cough. (Patient not taking: Reported on 06/16/2015)   . chlorpheniramine-HYDROcodone (TUSSIONEX PENNKINETIC ER) 10-8 MG/5ML SUER Take 5 mLs by mouth every 12 (twelve) hours as needed for cough. (Patient not taking: Reported on 06/16/2015)   . esomeprazole (NEXIUM) 40 MG capsule Take 20 mg by mouth every other day. Reported on 06/16/2015   . levofloxacin (LEVAQUIN) 500 MG tablet Take 1 tablet (500 mg total) by mouth daily. (Patient not taking: Reported on 06/16/2015)   . prochlorperazine (COMPAZINE) 10 MG tablet Reported on 06/16/2015 05/12/2015: [facility] SUMAtriptan (IMITREX) 100 MG tablet Take by mouth. Reported on 06/16/2015 05/12/2015: [facility]ceived from: [facility] No facility-administered encounter medications on file as of 06/16/2015.    Functional Status:  In your present state of health, do you have any difficulty performing the following activities: 05/09/2015  Hearing? N  Vision? N  Difficulty concentrating or making decisions? N  Walking or climbing stairs? N  Dressing or bathing? N  Doing errands, shopping? N    Fall/Depression Screening: PHQ  2/9 Scores 05/07/2015 01/27/2015 06/03/2014  PHQ - 2 Score 0 1 0    Assessment: [name] is working to keep her A1C in an acceptable range; she is exercising 20 minutes per day for the last 10 days- before that she required some steroids for an asthma exacerbation. She is checking her blood sugars fasting every day, eating three meals and limiting dietary intake. Despite this effort, A1C was elevated at 8.2% today.   Plan: Check sugars at sporadic times throughout the day     Aim for 11 carb servings per day (1 serving of carbs =15 grams)      Discuss with MD at next  visit in early June, 2017 [place] Problem One        Most Recent Value   Care Plan Problem One  Patient will increase exercise as tolerated to avoid asthma exacerbations and achive exercise 5x/week for 30 minutes by august 2016   Role Documenting the Problem One  [place] for Problem One  Not Active [As of 05/07/15,  patient did not meet the care plan set in December 2016 due to asthma exacerbation. ]   [facility] Long Term Goal (31-90 days)  Patient will exercise as tolerated to avoid asthma exacerbation,  5 days per week, 30 minutes per session   [facility] Long Term Goal Start Date  05/07/15   Interventions for Problem One Long Term Goal  Encouraged patient to continue using the Wii and practicing Yoga    [facility] CM Care Plan Problem Two        Most Recent Value   Care Plan Problem Two  Elevated A1C of 8.2%   Role Documenting the Problem Two  Care Management Coordinator   Care Plan for Problem Two  Active   Interventions for Problem Two Long Term Goal   1. check sugar at random times throughout the day  2. follow up with MD in June 2017   [place] Term Goal (31-90) days  Patient will decrease A1C to less than 8.1% by August 2017   [facility] Long Term Goal Start Date  06/16/15     I will follow up with Virginia Rogers in July, 2017.    [name], RN, BA, [place], [place] Direct Dial:  [phone]  Fax:  [phone] E-mail: [email or web address] [address], [place], [place]  [address]

## 2015-06-19 DIAGNOSIS — J4520 Mild intermittent asthma, uncomplicated: Secondary | ICD-10-CM | POA: Diagnosis not present

## 2015-06-19 DIAGNOSIS — R0602 Shortness of breath: Secondary | ICD-10-CM | POA: Diagnosis not present

## 2015-07-21 DIAGNOSIS — E119 Type 2 diabetes mellitus without complications: Secondary | ICD-10-CM | POA: Diagnosis not present

## 2015-07-21 DIAGNOSIS — Z0000 Encounter for general adult medical examination without abnormal findings: Secondary | ICD-10-CM | POA: Diagnosis not present

## 2015-07-28 ENCOUNTER — Other Ambulatory Visit: Payer: Self-pay | Admitting: Family Medicine

## 2015-07-28 DIAGNOSIS — N6001 Solitary cyst of right breast: Secondary | ICD-10-CM

## 2015-07-28 DIAGNOSIS — Z7189 Other specified counseling: Secondary | ICD-10-CM | POA: Insufficient documentation

## 2015-07-28 DIAGNOSIS — Z0001 Encounter for general adult medical examination with abnormal findings: Secondary | ICD-10-CM | POA: Diagnosis not present

## 2015-07-28 DIAGNOSIS — Z7185 Encounter for immunization safety counseling: Secondary | ICD-10-CM | POA: Insufficient documentation

## 2015-07-28 DIAGNOSIS — E119 Type 2 diabetes mellitus without complications: Secondary | ICD-10-CM | POA: Diagnosis not present

## 2015-07-28 DIAGNOSIS — E7800 Pure hypercholesterolemia, unspecified: Secondary | ICD-10-CM

## 2015-07-28 DIAGNOSIS — N6452 Nipple discharge: Secondary | ICD-10-CM

## 2015-07-28 HISTORY — DX: Pure hypercholesterolemia, unspecified: E78.00

## 2015-08-25 ENCOUNTER — Other Ambulatory Visit: Payer: Self-pay

## 2015-08-25 VITALS — BP 130/80 | Ht 65.0 in | Wt 224.7 lb

## 2015-08-25 DIAGNOSIS — E1165 Type 2 diabetes mellitus with hyperglycemia: Principal | ICD-10-CM

## 2015-08-25 DIAGNOSIS — IMO0001 Reserved for inherently not codable concepts without codable children: Secondary | ICD-10-CM

## 2015-08-25 NOTE — Patient Outreach (Signed)
[place] [facility]) Care Management  [place]  08/25/2015   Virginia Rogers 1960/08/05 [id number]  Subjective: Met with Virginia Rogers for the Link to Wellness diabetes program. She was disappointed and told me her A1C was "about the same" (I took it 06/16/15- 8.2%) as when I had checked it in May- Dr. Everlene Balls checked it in June.  Since her A1C was not ideal, MD increased Invokana to 347m- she complains that she had some nausea with it.  Fasting blood sugars are now 97-1376mdl, afternoon blood sugars are 124-15053ml and after meal blood sugars are around 150m57m. She is now tolerating the medication without difficulty- she denies hypo or hyperglycemia.She has had no exacerbations of her asthma and has been able to tolerate exercise 2x/week for 30-45 minutes. She has placed a dehumidifier in her house and thinks the constant humidification is helping her asthma.   Objective:  Filed Vitals:   08/25/15 1521  BP: 130/80  Height: 1.651 m (_0 )  Weight: 224 lb 11.2 oz (101.923 kg)     Encounter Medications:  Outpatient Encounter Prescriptions as of 08/25/2015  Medication Sig Note  . budesonide-formoterol (SYMBICORT) 160-4.5 MCG/ACT inhaler Inhale 2 puffs into the lungs daily after breakfast. prn   . Canagliflozin (INVOKANA PO) Take 300 mg by mouth daily. At lunch   . EPINEPHrine 1 MG/ML KIT Inject 0.3 mg as directed as needed. Take 0.3mg 27me a day as needed   . fexofenadine (ALLEGRA) 180 MG tablet Take 180 mg by mouth daily. Reported on 01/27/2015   . glimepiride (AMARYL) 1 MG tablet Take 2 mg by mouth 2 (two) times daily.    . lorMarland Kitchentadine (CLARITIN) 10 MG tablet Take 10 mg by mouth daily.   . losMarland Kitchenrtan (COZAAR) 25 MG tablet Take 25 mg by mouth at bedtime. Reported on 03/07/2015   . Omega-3 Fatty Acids (FISH OIL CONCENTRATE) 300 MG CAPS Take 1 tablet by mouth at bedtime.   . prochlorperazine (COMPAZINE) 10 MG tablet Take 10 mg by mouth every 6 (six) hours as needed for nausea or  vomiting (with migraines).   . SUMAtriptan (IMITREX) 100 MG tablet Take 100 mg by mouth as needed. Repeat after 4 hours if needed, no more than 2 tabs per 24 hours   . benzonatate (TESSALON) 200 MG capsule Take 1 capsule (200 mg total) by mouth 2 (two) times daily as needed for cough. (Patient not taking: Reported on 06/16/2015)   . benzonatate (TESSALON) 200 MG capsule Take 1 capsule (200 mg total) by mouth 3 (three) times daily as needed for cough. (Patient not taking: Reported on 06/16/2015)   . chlorpheniramine-HYDROcodone (TUSSIONEX PENNKINETIC ER) 10-8 MG/5ML SUER Take 5 mLs by mouth every 12 (twelve) hours as needed for cough. (Patient not taking: Reported on 06/16/2015)   . esomeprazole (NEXIUM) 40 MG capsule Take 20 mg by mouth every other day. Reported on 08/25/2015   . INVOKANA 100 MG TABS tablet 100 mg. Reported on 08/25/2015 06/16/2015: Received Sig:   . levofloxacin (LEVAQUIN) 500 MG tablet Take 1 tablet (500 mg total) by mouth daily. (Patient not taking: Reported on 06/16/2015)   . prochlorperazine (COMPAZINE) 10 MG tablet Reported on 08/25/2015 05/12/2015: [facility] 100 MG tablet Take by mouth. Reported on 08/25/2015 05/12/2015: Received from: [facility] facility-administered encounter medications on file as of 08/25/2015.    Functional Status:  In your present state of health, do you have any difficulty  performing the following activities: 05/09/2015  Hearing? N  Vision? N  Difficulty concentrating or making decisions? N  Walking or climbing stairs? N  Dressing or bathing? N  Doing errands, shopping? N    Fall/Depression Screening: PHQ 2/9 Scores 05/07/2015 01/27/2015 06/03/2014  PHQ - 2 Score 0 1 0    Assessment: After reviewing Virginia Rogers's medical records, her A1C has improved to 7.5% when it was assessed in June 2017 with Dr. Carlean Jews. - she was thrilled! Engaged, consistently checking her blood sugars. She is eating  every 3.5-4 hours and has something to eat before she leaves work to prevent low blood sugars. She is taking fish oil 357m/day after she could not tolerate red yeast rice (caused her eyes to swell).     Plan:  [facility]        Most Recent Value   Care Plan Problem One  -- [Elevated A1C]   Role Documenting the Problem One  Care Management Coordinator   Care Plan for Problem One  Active   [facility]N Long Term Goal (31-90 days)  Patient will decrease A1C to below 7.5% when it is rechecked in 3 months   [facility] Long Term Goal Start Date  08/25/15   Interventions for Problem One Long Term Goal  1. reviewed carbs with a goal of 11servings minimally per day 2. reviewed triggers for elevated CBG including stress,work, illness, lack of meds, timing of meals, meal composition     I will follow up with CMalachy Moodin August. She will continue to try and exercise at least 2 days / week.    JGentry Fitz RN, BA, [place] CSan RamonDirect Dial:  [phone] Fax:  [phone]-mail: [email or web address] [address] [place]  [id number]

## 2015-09-01 ENCOUNTER — Other Ambulatory Visit: Payer: Self-pay

## 2015-09-01 ENCOUNTER — Ambulatory Visit: Payer: 59

## 2015-09-08 ENCOUNTER — Ambulatory Visit
Admission: RE | Admit: 2015-09-08 | Discharge: 2015-09-08 | Disposition: A | Discharge status: Home / Self Care | Payer: 59 | Source: Ambulatory Visit | Attending: Family Medicine | Admitting: Family Medicine

## 2015-09-08 ENCOUNTER — Other Ambulatory Visit: Payer: Self-pay | Admitting: Family Medicine

## 2015-09-08 ENCOUNTER — Other Ambulatory Visit: Payer: Self-pay | Admitting: Body Imaging

## 2015-09-08 DIAGNOSIS — N6001 Solitary cyst of right breast: Secondary | ICD-10-CM | POA: Diagnosis not present

## 2015-09-08 DIAGNOSIS — N6452 Nipple discharge: Secondary | ICD-10-CM | POA: Insufficient documentation

## 2015-09-08 DIAGNOSIS — R922 Inconclusive mammogram: Secondary | ICD-10-CM | POA: Diagnosis not present

## 2015-09-08 DIAGNOSIS — N63 Unspecified lump in breast: Secondary | ICD-10-CM | POA: Insufficient documentation

## 2015-09-09 ENCOUNTER — Other Ambulatory Visit: Payer: Self-pay | Admitting: Family Medicine

## 2015-09-09 DIAGNOSIS — R928 Other abnormal and inconclusive findings on diagnostic imaging of breast: Secondary | ICD-10-CM

## 2015-09-15 ENCOUNTER — Ambulatory Visit
Admission: RE | Admit: 2015-09-15 | Discharge: 2015-09-15 | Disposition: A | Discharge status: Home / Self Care | Payer: 59 | Source: Ambulatory Visit | Attending: Family Medicine | Admitting: Family Medicine

## 2015-09-15 ENCOUNTER — Other Ambulatory Visit: Payer: Self-pay | Admitting: Family Medicine

## 2015-09-15 DIAGNOSIS — R928 Other abnormal and inconclusive findings on diagnostic imaging of breast: Secondary | ICD-10-CM

## 2015-09-15 DIAGNOSIS — N6489 Other specified disorders of breast: Secondary | ICD-10-CM | POA: Diagnosis not present

## 2015-09-15 DIAGNOSIS — D242 Benign neoplasm of left breast: Secondary | ICD-10-CM | POA: Diagnosis not present

## 2015-09-15 HISTORY — PX: BREAST BIOPSY: SHX20

## 2015-09-19 DIAGNOSIS — L7632 Postprocedural hematoma of skin and subcutaneous tissue following other procedure: Secondary | ICD-10-CM | POA: Diagnosis not present

## 2015-09-29 ENCOUNTER — Ambulatory Visit: Payer: Self-pay

## 2015-09-29 ENCOUNTER — Other Ambulatory Visit: Payer: Self-pay

## 2015-09-29 NOTE — Patient Outreach (Signed)
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  She reports that 2 weeks after the biopsy, she still has a hematoma.   Fasting blood sugars 110-155mg /dl- she attributes this to extra activity outside.   She feels like her asthma is beginning to flare and she has proactively scheduled an appointment with Dr. Raul Del.    [name], RN, BA, MHA, CDE Diabetes Coordinator Inpatient Diabetes Program  [phone] (Team Pager) [phone] ([place]) 09/29/2015 4:10 PM

## 2015-11-03 ENCOUNTER — Other Ambulatory Visit: Payer: Self-pay

## 2015-11-03 NOTE — Patient Outreach (Signed)
[facility]) Care Management  11/03/2015  Virginia Rogers 08-19-1960 [id number]  Spoke to [place] by phone today.  She continues to exercise 15-20 minutes 5x/week.  She reports her fasting blood sugars as 120-130mg /dl (this morning's fasting blood sugar was 160mg /dl because she ate supper at 9pm). She does notice that her blood sugars are 75-80mg /dl at around 2-3pm when she finishes work.  She eats a snack on her way home from work. She has not been bothered by her allergies recently. She continues to take her diabetes medications as ordered.   follow   [name], RN, BA, [place], [place] To [facility] Direct Dial:  [phone]  Fax:  [phone] E-mail: [email or web address] [address], [place], [place]  [address]

## 2015-11-07 ENCOUNTER — Encounter: Payer: Self-pay | Admitting: Physician Assistant

## 2015-11-07 ENCOUNTER — Ambulatory Visit: Payer: Self-pay | Admitting: Physician Assistant

## 2015-11-07 VITALS — BP 130/80 | HR 64 | Temp 98.4°F

## 2015-11-07 DIAGNOSIS — S92911A Unspecified fracture of right toe(s), initial encounter for closed fracture: Secondary | ICD-10-CM

## 2015-11-07 NOTE — Progress Notes (Signed)
S: c/o r pinky toe pain, states she hit it on furniture while making the bed, jammed it but the furniture went between 2 toes, buddy taped the toe, then someone ran an iv pole over her foot, pain 10/10 at the time, still hurts, sx since Tues, area feels numb, no tingling  O: vitals wnl, nad, r pinky toe tender to palp, r 4th toe tender to palp, metatarsals nontender, n/v intact Buddy taped toes, applied wooden shoe  A: fractured toe  P: if not improving by Monday willr efer to podiatry due to furniture going between toes concern of nerve damage, pt will ice and elevated, states the otc meds should work for pain

## 2015-11-11 ENCOUNTER — Telehealth: Payer: Self-pay | Admitting: Physician Assistant

## 2015-11-11 NOTE — Telephone Encounter (Signed)
Contacted [facility] per patient request scheduled appt with Dr. Cleda Mccreedy on 11/18/2015 @ 11:30. Patient was notified.

## 2015-11-12 DIAGNOSIS — E7800 Pure hypercholesterolemia, unspecified: Secondary | ICD-10-CM | POA: Diagnosis not present

## 2015-11-12 DIAGNOSIS — E119 Type 2 diabetes mellitus without complications: Secondary | ICD-10-CM | POA: Diagnosis not present

## 2015-11-18 DIAGNOSIS — E119 Type 2 diabetes mellitus without complications: Secondary | ICD-10-CM | POA: Diagnosis not present

## 2015-11-18 DIAGNOSIS — E7800 Pure hypercholesterolemia, unspecified: Secondary | ICD-10-CM | POA: Diagnosis not present

## 2015-11-18 DIAGNOSIS — G43009 Migraine without aura, not intractable, without status migrainosus: Secondary | ICD-10-CM | POA: Diagnosis not present

## 2015-11-21 DIAGNOSIS — S90121A Contusion of right lesser toe(s) without damage to nail, initial encounter: Secondary | ICD-10-CM | POA: Diagnosis not present

## 2015-11-21 DIAGNOSIS — M79671 Pain in right foot: Secondary | ICD-10-CM | POA: Diagnosis not present

## 2015-11-27 DIAGNOSIS — J4521 Mild intermittent asthma with (acute) exacerbation: Secondary | ICD-10-CM | POA: Diagnosis not present

## 2016-03-03 ENCOUNTER — Other Ambulatory Visit: Payer: Self-pay | Admitting: Family Medicine

## 2016-03-03 DIAGNOSIS — R928 Other abnormal and inconclusive findings on diagnostic imaging of breast: Secondary | ICD-10-CM

## 2016-03-08 DIAGNOSIS — J4520 Mild intermittent asthma, uncomplicated: Secondary | ICD-10-CM | POA: Diagnosis not present

## 2016-03-22 ENCOUNTER — Ambulatory Visit
Admission: RE | Admit: 2016-03-22 | Discharge: 2016-03-22 | Disposition: A | Discharge status: Home / Self Care | Payer: 59 | Source: Ambulatory Visit | Attending: Family Medicine | Admitting: Family Medicine

## 2016-03-22 ENCOUNTER — Other Ambulatory Visit: Payer: Self-pay | Admitting: Family Medicine

## 2016-03-22 DIAGNOSIS — R928 Other abnormal and inconclusive findings on diagnostic imaging of breast: Secondary | ICD-10-CM | POA: Insufficient documentation

## 2016-03-22 DIAGNOSIS — N6322 Unspecified lump in the left breast, upper inner quadrant: Secondary | ICD-10-CM | POA: Diagnosis not present

## 2016-03-22 DIAGNOSIS — N6324 Unspecified lump in the left breast, lower inner quadrant: Secondary | ICD-10-CM | POA: Diagnosis not present

## 2016-03-24 ENCOUNTER — Encounter: Payer: Self-pay | Admitting: Family

## 2016-03-24 ENCOUNTER — Ambulatory Visit: Payer: Self-pay | Admitting: Family

## 2016-03-24 VITALS — BP 100/70 | HR 91 | Temp 98.7°F

## 2016-03-24 DIAGNOSIS — J0100 Acute maxillary sinusitis, unspecified: Secondary | ICD-10-CM

## 2016-03-24 DIAGNOSIS — R05 Cough: Secondary | ICD-10-CM

## 2016-03-24 DIAGNOSIS — R062 Wheezing: Secondary | ICD-10-CM

## 2016-03-24 DIAGNOSIS — R059 Cough, unspecified: Secondary | ICD-10-CM

## 2016-03-24 MED ORDER — HYDROCOD POLST-CPM POLST ER 10-8 MG/5ML PO SUER: 5.0000 mL | Freq: Two times a day (BID) | ORAL | 0 refills | Status: DC | PRN

## 2016-03-24 MED ORDER — AZITHROMYCIN 250 MG PO TABS: ORAL_TABLET | ORAL | 0 refills | Status: DC

## 2016-03-24 MED ORDER — PREDNISONE 10 MG (21) PO TBPK: 10.0000 mg | ORAL_TABLET | Freq: Every day | ORAL | 0 refills | Status: DC

## 2016-03-24 NOTE — Progress Notes (Signed)
S/ one week hx of  Illness, began with gi upset, now with cough not relieved by tessalon pearles  , blowing green and increased wheezing and using her xopenex /neb & last time was one hour ago  , she has been working ,denies body aches   O/ VSS alert pleasant coughing with attempts to talk /inspire , ENT + max sinus tenderness, nasal turbinates red ,swollen, throat clear, neck supple , heart rsr lungs clear.  A/ Sinusitis, cough, wheezing P Supportive measures discussed.     Nasal saline products bid and prn rx zpack, pred taper x 6 , rx written tussionex 1 tsp   12 H prn cough #110 o rf .  F/u prn not improving.

## 2016-03-31 ENCOUNTER — Other Ambulatory Visit: Payer: Self-pay | Admitting: Surgery

## 2016-03-31 DIAGNOSIS — N6320 Unspecified lump in the left breast, unspecified quadrant: Secondary | ICD-10-CM

## 2016-03-31 DIAGNOSIS — N63 Unspecified lump in unspecified breast: Secondary | ICD-10-CM | POA: Diagnosis not present

## 2016-04-08 ENCOUNTER — Encounter
Admission: RE | Admit: 2016-04-08 | Discharge: 2016-04-08 | Disposition: A | Discharge status: Home / Self Care | Payer: 59 | Source: Ambulatory Visit | Attending: Surgery | Admitting: Surgery

## 2016-04-08 DIAGNOSIS — Z7984 Long term (current) use of oral hypoglycemic drugs: Secondary | ICD-10-CM | POA: Diagnosis not present

## 2016-04-08 DIAGNOSIS — J45909 Unspecified asthma, uncomplicated: Secondary | ICD-10-CM | POA: Diagnosis not present

## 2016-04-08 DIAGNOSIS — Z01812 Encounter for preprocedural laboratory examination: Secondary | ICD-10-CM | POA: Diagnosis not present

## 2016-04-08 DIAGNOSIS — E119 Type 2 diabetes mellitus without complications: Secondary | ICD-10-CM | POA: Insufficient documentation

## 2016-04-08 DIAGNOSIS — Z86010 Personal history of colonic polyps: Secondary | ICD-10-CM | POA: Insufficient documentation

## 2016-04-08 DIAGNOSIS — Z01810 Encounter for preprocedural cardiovascular examination: Secondary | ICD-10-CM | POA: Insufficient documentation

## 2016-04-08 DIAGNOSIS — K219 Gastro-esophageal reflux disease without esophagitis: Secondary | ICD-10-CM | POA: Insufficient documentation

## 2016-04-08 HISTORY — DX: Headache, unspecified: R51.9

## 2016-04-08 HISTORY — DX: Headache: R51

## 2016-04-08 HISTORY — DX: Personal history of other infectious and parasitic diseases: Z86.19

## 2016-04-08 LAB — DIFFERENTIAL
BASOS ABS: 0.1 10*3/uL (ref 0–0.1)
Basophils Relative: 1 %
EOS ABS: 0.2 10*3/uL (ref 0–0.7)
EOS PCT: 3 %
Lymphocytes Relative: 29 %
Lymphs Abs: 2.3 10*3/uL (ref 1.0–3.6)
Monocytes Absolute: 0.7 10*3/uL (ref 0.2–0.9)
Monocytes Relative: 9 %
NEUTROS PCT: 58 %
Neutro Abs: 4.7 10*3/uL (ref 1.4–6.5)

## 2016-04-08 LAB — CBC
HCT: 42.1 % (ref 35.0–47.0)
Hemoglobin: 14.8 g/dL (ref 12.0–16.0)
MCH: 31.1 pg (ref 26.0–34.0)
MCHC: 35.1 g/dL (ref 32.0–36.0)
MCV: 88.6 fL (ref 80.0–100.0)
PLATELETS: 451 10*3/uL — AB (ref 150–440)
RBC: 4.75 MIL/uL (ref 3.80–5.20)
RDW: 13.6 % (ref 11.5–14.5)
WBC: 7.9 10*3/uL (ref 3.6–11.0)

## 2016-04-08 LAB — COMPREHENSIVE METABOLIC PANEL
ALBUMIN: 4.4 g/dL (ref 3.5–5.0)
ALT: 26 U/L (ref 14–54)
ANION GAP: 8 (ref 5–15)
AST: 17 U/L (ref 15–41)
Alkaline Phosphatase: 76 U/L (ref 38–126)
BUN: 18 mg/dL (ref 6–20)
CO2: 23 mmol/L (ref 22–32)
Calcium: 9.3 mg/dL (ref 8.9–10.3)
Chloride: 106 mmol/L (ref 101–111)
Creatinine, Ser: 0.76 mg/dL (ref 0.44–1.00)
GFR calc Af Amer: >60 mL/min (ref >60)
GFR calc non Af Amer: >60 mL/min (ref >60)
GLUCOSE: 122 mg/dL — AB (ref 65–99)
POTASSIUM: 3.6 mmol/L (ref 3.5–5.1)
SODIUM: 137 mmol/L (ref 135–145)
Total Bilirubin: 0.4 mg/dL (ref 0.3–1.2)
Total Protein: 7.8 g/dL (ref 6.5–8.1)

## 2016-04-08 NOTE — Patient Instructions (Signed)
  Your procedure is scheduled on: Thursday April 15, 2016. Report to [place] at 7:50 am.   Remember: Instructions that are not followed completely may result in serious medical risk, up to and including death, or upon the discretion of your surgeon and anesthesiologist your surgery may need to be rescheduled.    _x___ 1. Do not eat food or drink liquids after midnight. No gum chewing or hard candies.     _x___ 2. No Alcohol for 24 hours before or after surgery.   ____ 3. Bring all medications with you on the day of surgery if instructed.    __x__ 4. Notify your doctor if there is any change in your medical condition     (cold, fever, infections).    _____ 5. No smoking 24 hours prior to surgery.     Do not wear jewelry, make-up, hairpins, clips or nail polish.  Do not wear lotions, powders, or perfumes.   Do not shave 48 hours prior to surgery. Men may shave face and neck.  Do not bring valuables to the hospital.    [facility] is not responsible for any belongings or valuables.               Contacts, dentures or bridgework may not be worn into surgery.  Leave your suitcase in the car. After surgery it may be brought to your room.  For patients admitted to the hospital, discharge time is determined by your treatment team.   Patients discharged the day of surgery will not be allowed to drive home.    Please read over the following fact sheets that you were given:   [facility] Preparing for Surgery  ____ Take these medicines the morning of surgery with A SIP OF WATER: NONE    ____ Fleet Enema (as directed)   _x___ Use CHG Soap as directed on instruction sheet  _x___ Use inhalers on the day of surgery and bring to hospital day of surgery  ____ Stop metformin 2 days prior to surgery    ____ Take 1/2 of usual insulin dose the night before surgery and none on the morning of surgery.   ____ Stop Coumadin/Plavix/aspirin on does not apply.  _x___ Stop  Anti-inflammatories such as Advil, Aleve, Ibuprofen, Motrin, Naproxen, Naprosyn, Goodies powders or aspirin products. OK to take Tylenol.   ____ Stop supplements:Omega-3 Fatty Acids ([place])  until after surgery.    ____ Bring C-Pap to the hospital.

## 2016-04-15 ENCOUNTER — Ambulatory Visit
Admission: RE | Admit: 2016-04-15 | Discharge: 2016-04-15 | Disposition: A | Discharge status: Home / Self Care | Payer: 59 | Source: Ambulatory Visit | Attending: Surgery | Admitting: Surgery

## 2016-04-15 ENCOUNTER — Encounter
Admission: RE | Disposition: A | Discharge status: Home / Self Care | Payer: Self-pay | Source: Ambulatory Visit | Attending: Surgery

## 2016-04-15 ENCOUNTER — Encounter: Payer: Self-pay | Admitting: *Deleted

## 2016-04-15 ENCOUNTER — Ambulatory Visit: Payer: 59 | Admitting: Anesthesiology

## 2016-04-15 DIAGNOSIS — Z90710 Acquired absence of both cervix and uterus: Secondary | ICD-10-CM | POA: Diagnosis not present

## 2016-04-15 DIAGNOSIS — Z9882 Breast implant status: Secondary | ICD-10-CM | POA: Diagnosis not present

## 2016-04-15 DIAGNOSIS — Z79899 Other long term (current) drug therapy: Secondary | ICD-10-CM | POA: Diagnosis not present

## 2016-04-15 DIAGNOSIS — Z8261 Family history of arthritis: Secondary | ICD-10-CM | POA: Diagnosis not present

## 2016-04-15 DIAGNOSIS — Z880 Allergy status to penicillin: Secondary | ICD-10-CM | POA: Diagnosis not present

## 2016-04-15 DIAGNOSIS — Z91048 Other nonmedicinal substance allergy status: Secondary | ICD-10-CM | POA: Diagnosis not present

## 2016-04-15 DIAGNOSIS — Z833 Family history of diabetes mellitus: Secondary | ICD-10-CM | POA: Diagnosis not present

## 2016-04-15 DIAGNOSIS — N6320 Unspecified lump in the left breast, unspecified quadrant: Secondary | ICD-10-CM | POA: Diagnosis not present

## 2016-04-15 DIAGNOSIS — Z825 Family history of asthma and other chronic lower respiratory diseases: Secondary | ICD-10-CM | POA: Diagnosis not present

## 2016-04-15 DIAGNOSIS — Z794 Long term (current) use of insulin: Secondary | ICD-10-CM | POA: Diagnosis not present

## 2016-04-15 DIAGNOSIS — K219 Gastro-esophageal reflux disease without esophagitis: Secondary | ICD-10-CM | POA: Diagnosis not present

## 2016-04-15 DIAGNOSIS — Z9889 Other specified postprocedural states: Secondary | ICD-10-CM | POA: Diagnosis not present

## 2016-04-15 DIAGNOSIS — Z6837 Body mass index (BMI) 37.0-37.9, adult: Secondary | ICD-10-CM | POA: Diagnosis not present

## 2016-04-15 DIAGNOSIS — Z8489 Family history of other specified conditions: Secondary | ICD-10-CM | POA: Diagnosis not present

## 2016-04-15 DIAGNOSIS — D242 Benign neoplasm of left breast: Secondary | ICD-10-CM | POA: Insufficient documentation

## 2016-04-15 DIAGNOSIS — Z888 Allergy status to other drugs, medicaments and biological substances status: Secondary | ICD-10-CM | POA: Insufficient documentation

## 2016-04-15 DIAGNOSIS — Z86010 Personal history of colonic polyps: Secondary | ICD-10-CM | POA: Insufficient documentation

## 2016-04-15 DIAGNOSIS — N6012 Diffuse cystic mastopathy of left breast: Secondary | ICD-10-CM | POA: Insufficient documentation

## 2016-04-15 DIAGNOSIS — E669 Obesity, unspecified: Secondary | ICD-10-CM | POA: Insufficient documentation

## 2016-04-15 DIAGNOSIS — Z8249 Family history of ischemic heart disease and other diseases of the circulatory system: Secondary | ICD-10-CM | POA: Diagnosis not present

## 2016-04-15 DIAGNOSIS — Z7984 Long term (current) use of oral hypoglycemic drugs: Secondary | ICD-10-CM | POA: Diagnosis not present

## 2016-04-15 DIAGNOSIS — E119 Type 2 diabetes mellitus without complications: Secondary | ICD-10-CM | POA: Insufficient documentation

## 2016-04-15 DIAGNOSIS — J45909 Unspecified asthma, uncomplicated: Secondary | ICD-10-CM | POA: Diagnosis not present

## 2016-04-15 DIAGNOSIS — N6489 Other specified disorders of breast: Secondary | ICD-10-CM | POA: Diagnosis not present

## 2016-04-15 DIAGNOSIS — N6321 Unspecified lump in the left breast, upper outer quadrant: Secondary | ICD-10-CM | POA: Diagnosis present

## 2016-04-15 HISTORY — PX: BREAST LUMPECTOMY WITH NEEDLE LOCALIZATION: SHX5759

## 2016-04-15 HISTORY — PX: BREAST EXCISIONAL BIOPSY: SUR124

## 2016-04-15 LAB — GLUCOSE, CAPILLARY
GLUCOSE-CAPILLARY: 147 mg/dL — AB (ref 65–99)
Glucose-Capillary: 116 mg/dL — ABNORMAL HIGH (ref 65–99)

## 2016-04-15 SURGERY — BREAST LUMPECTOMY WITH NEEDLE LOCALIZATION
Anesthesia: General | Laterality: Left | Wound class: Clean

## 2016-04-15 MED ORDER — HYDROMORPHONE HCL 1 MG/ML IJ SOLN
INTRAMUSCULAR | Status: AC
  Filled 2016-04-15: qty 1

## 2016-04-15 MED ORDER — PROPOFOL 10 MG/ML IV BOLUS
INTRAVENOUS | Status: AC
  Filled 2016-04-15: qty 20

## 2016-04-15 MED ORDER — DEXMEDETOMIDINE HCL IN NACL 200 MCG/50ML IV SOLN
INTRAVENOUS | Status: AC
  Filled 2016-04-15: qty 50

## 2016-04-15 MED ORDER — LACTATED RINGERS IV SOLN
INTRAVENOUS | Status: DC | PRN
  Administered 2016-04-15: 10:00:00 via INTRAVENOUS

## 2016-04-15 MED ORDER — DEXAMETHASONE SODIUM PHOSPHATE 10 MG/ML IJ SOLN
INTRAMUSCULAR | Status: AC
  Filled 2016-04-15: qty 1

## 2016-04-15 MED ORDER — ONDANSETRON HCL 4 MG/2ML IJ SOLN
INTRAMUSCULAR | Status: DC | PRN
  Administered 2016-04-15: 4 mg via INTRAVENOUS

## 2016-04-15 MED ORDER — FAMOTIDINE 20 MG PO TABS: 20.0000 mg | ORAL_TABLET | Freq: Once | ORAL | Status: DC

## 2016-04-15 MED ORDER — SODIUM CHLORIDE 0.9 % IV SOLN
INTRAVENOUS | Status: DC
Start: 2016-04-15 — End: 2016-04-15
  Administered 2016-04-15: 09:00:00 via INTRAVENOUS

## 2016-04-15 MED ORDER — PROPOFOL 10 MG/ML IV BOLUS
INTRAVENOUS | Status: DC | PRN
  Administered 2016-04-15: 150 mg via INTRAVENOUS

## 2016-04-15 MED ORDER — MIDAZOLAM HCL 2 MG/2ML IJ SOLN
INTRAMUSCULAR | Status: DC | PRN
  Administered 2016-04-15: 2 mg via INTRAVENOUS

## 2016-04-15 MED ORDER — ONDANSETRON HCL 4 MG/2ML IJ SOLN
INTRAMUSCULAR | Status: AC
  Filled 2016-04-15: qty 2

## 2016-04-15 MED ORDER — FENTANYL CITRATE (PF) 100 MCG/2ML IJ SOLN
INTRAMUSCULAR | Status: AC
  Filled 2016-04-15: qty 2

## 2016-04-15 MED ORDER — BUPIVACAINE-EPINEPHRINE 0.5% -1:200000 IJ SOLN
INTRAMUSCULAR | Status: DC | PRN
  Administered 2016-04-15: 14 mL

## 2016-04-15 MED ORDER — HYDROCODONE-ACETAMINOPHEN 5-325 MG PO TABS: 1.0000 | ORAL_TABLET | ORAL | Status: DC | PRN

## 2016-04-15 MED ORDER — LIDOCAINE HCL (CARDIAC) 20 MG/ML IV SOLN
INTRAVENOUS | Status: DC | PRN
  Administered 2016-04-15: 100 mg via INTRAVENOUS

## 2016-04-15 MED ORDER — FENTANYL CITRATE (PF) 100 MCG/2ML IJ SOLN: 25.0000 ug | INTRAMUSCULAR | Status: DC | PRN

## 2016-04-15 MED ORDER — PHENYLEPHRINE HCL 10 MG/ML IJ SOLN
INTRAMUSCULAR | Status: DC | PRN
  Administered 2016-04-15: 100 ug via INTRAVENOUS

## 2016-04-15 MED ORDER — ONDANSETRON HCL 4 MG/2ML IJ SOLN: 4.0000 mg | Freq: Once | INTRAMUSCULAR | Status: DC | PRN

## 2016-04-15 MED ORDER — HYDROCODONE-ACETAMINOPHEN 5-325 MG PO TABS: 1.0000 | ORAL_TABLET | ORAL | 0 refills | Status: DC | PRN

## 2016-04-15 MED ORDER — MIDAZOLAM HCL 2 MG/2ML IJ SOLN
INTRAMUSCULAR | Status: AC
  Filled 2016-04-15: qty 2

## 2016-04-15 MED ORDER — DEXAMETHASONE SODIUM PHOSPHATE 10 MG/ML IJ SOLN
INTRAMUSCULAR | Status: DC | PRN
  Administered 2016-04-15: 10 mg via INTRAVENOUS

## 2016-04-15 MED ORDER — HYDROMORPHONE HCL 1 MG/ML IJ SOLN
INTRAMUSCULAR | Status: DC | PRN
  Administered 2016-04-15 (×2): .6 mg via INTRAVENOUS

## 2016-04-15 MED ORDER — FAMOTIDINE 20 MG PO TABS
ORAL_TABLET | ORAL | Status: AC
  Administered 2016-04-15: 20 mg
  Filled 2016-04-15: qty 1

## 2016-04-15 MED ORDER — FENTANYL CITRATE (PF) 100 MCG/2ML IJ SOLN
INTRAMUSCULAR | Status: DC | PRN
  Administered 2016-04-15: 50 ug via INTRAVENOUS

## 2016-04-15 MED ORDER — PHENYLEPHRINE HCL 10 MG/ML IJ SOLN
INTRAMUSCULAR | Status: AC
  Filled 2016-04-15: qty 1

## 2016-04-15 MED ORDER — FENTANYL CITRATE (PF) 100 MCG/2ML IJ SOLN: INTRAMUSCULAR | Status: DC | PRN

## 2016-04-15 MED ORDER — BUPIVACAINE-EPINEPHRINE (PF) 0.5% -1:200000 IJ SOLN
INTRAMUSCULAR | Status: AC
  Filled 2016-04-15: qty 30

## 2016-04-15 SURGICAL SUPPLY — 28 items
BLADE SURG 15 STRL LF DISP TIS (BLADE) ×1 IMPLANT
BLADE SURG 15 STRL SS (BLADE) ×1
CANISTER SUCT 1200ML W/VALVE (MISCELLANEOUS) ×2 IMPLANT
CHLORAPREP W/TINT 26ML (MISCELLANEOUS) ×2 IMPLANT
CNTNR SPEC 2.5X3XGRAD LEK (MISCELLANEOUS) ×1
CONT SPEC 4OZ STER OR WHT (MISCELLANEOUS) ×1
CONTAINER SPEC 2.5X3XGRAD LEK (MISCELLANEOUS) ×1 IMPLANT
DERMABOND ADVANCED (GAUZE/BANDAGES/DRESSINGS) ×1
DERMABOND ADVANCED .7 DNX12 (GAUZE/BANDAGES/DRESSINGS) ×1 IMPLANT
DRAPE LAPAROTOMY 77X122 PED (DRAPES) ×2 IMPLANT
ELECT REM PT RETURN 9FT ADLT (ELECTROSURGICAL) ×2
ELECTRODE REM PT RTRN 9FT ADLT (ELECTROSURGICAL) ×1 IMPLANT
GLOVE BIO SURGEON STRL SZ7.5 (GLOVE) ×2 IMPLANT
GOWN STRL REUS W/ TWL LRG LVL3 (GOWN DISPOSABLE) ×2 IMPLANT
GOWN STRL REUS W/TWL LRG LVL3 (GOWN DISPOSABLE) ×2
KIT RM TURNOVER STRD PROC AR (KITS) ×2 IMPLANT
LABEL OR SOLS (LABEL) ×2 IMPLANT
MARGIN MAP 10MM (MISCELLANEOUS) ×2 IMPLANT
NEEDLE HYPO 25X1 1.5 SAFETY (NEEDLE) ×2 IMPLANT
PACK BASIN MINOR ARMC (MISCELLANEOUS) ×2 IMPLANT
SUT CHROMIC 4 0 RB 1X27 (SUTURE) ×2 IMPLANT
SUT ETHILON 3-0 FS-10 30 BLK (SUTURE) ×2
SUT MNCRL 4-0 (SUTURE) ×1
SUT MNCRL 4-0 27XMFL (SUTURE) ×1
SUTURE EHLN 3-0 FS-10 30 BLK (SUTURE) ×1 IMPLANT
SUTURE MNCRL 4-0 27XMF (SUTURE) ×1 IMPLANT
SYRINGE 10CC LL (SYRINGE) ×2 IMPLANT
WATER STERILE IRR 1000ML POUR (IV SOLUTION) ×2 IMPLANT

## 2016-04-15 NOTE — Discharge Instructions (Signed)
°  Take Tylenol or Norco if needed for pain.  Should not drive or do anything dangerous when taking Norco.  May shower and blot dry.   Wear bra as desired for comfort and support.

## 2016-04-15 NOTE — H&P (Signed)
  She came today for excision left breast mass.  She reports no change in condition.  Kopans wire has been inserted and films viewed.  Left side marked YES  Blood sugar 147  Discussed plan for surgery.

## 2016-04-15 NOTE — Anesthesia Post-op Follow-up Note (Cosign Needed)
Anesthesia QCDR form completed.        

## 2016-04-15 NOTE — Anesthesia Preprocedure Evaluation (Signed)
Anesthesia Evaluation  Patient identified by MRN, date of birth, ID band Patient awake    Reviewed: Allergy & Precautions, NPO status , Patient's Chart, lab work & pertinent test results  Airway Mallampati: II  TM Distance: >3 FB     Dental  (+) Caps   Pulmonary asthma ,    Pulmonary exam normal        Cardiovascular negative cardio ROS Normal cardiovascular exam     Neuro/Psych  Headaches, negative psych ROS   GI/Hepatic Neg liver ROS, GERD  Medicated and Controlled,  Endo/Other  diabetes, Well Controlled, Type 2, Oral Hypoglycemic Agents  Renal/GU negative Renal ROS  negative genitourinary   Musculoskeletal   Abdominal Normal abdominal exam  (+)   Peds negative pediatric ROS (+)  Hematology negative hematology ROS (+)   Anesthesia Other Findings   Reproductive/Obstetrics                             Anesthesia Physical Anesthesia Plan  ASA: II  Anesthesia Plan: General   Post-op Pain Management:    Induction: Intravenous  Airway Management Planned: LMA  Additional Equipment:   Intra-op Plan:   Post-operative Plan: Extubation in OR  Informed Consent: I have reviewed the patients History and Physical, chart, labs and discussed the procedure including the risks, benefits and alternatives for the proposed anesthesia with the patient or authorized representative who has indicated his/her understanding and acceptance.     Plan Discussed with: CRNA and Surgeon  Anesthesia Plan Comments:         Anesthesia Quick Evaluation

## 2016-04-15 NOTE — Transfer of Care (Signed)
Immediate Anesthesia Transfer of Care Note  Patient: Virginia Rogers  Procedure(s) Performed: Procedure(s): BREAST LUMPECTOMY WITH NEEDLE LOCALIZATION (Left)  Patient Location: PACU  Anesthesia Type:General  Level of Consciousness: sedated  Airway & Oxygen Therapy: Patient Spontanous Breathing  Post-op Assessment: Report given to RN and Post -op Vital signs reviewed and stable  Post vital signs: Reviewed and stable  Last Vitals:  Vitals:   04/15/16 0851  BP: (!) 138/92  Pulse: 92  Resp: 18  Temp: 36.6 C    Last Pain:  Vitals:   04/15/16 0851  TempSrc: Oral         Complications: No apparent anesthesia complications

## 2016-04-15 NOTE — Anesthesia Procedure Notes (Signed)
Procedure Name: LMA Insertion Date/Time: 04/15/2016 10:21 AM Performed by: Justus Memory Pre-anesthesia Checklist: Patient identified, Emergency Drugs available, Suction available and Patient being monitored Patient Re-evaluated:Patient Re-evaluated prior to inductionOxygen Delivery Method: Circle system utilized Preoxygenation: Pre-oxygenation with 100% oxygen Intubation Type: IV induction LMA: LMA inserted LMA Size: 3.5 Placement Confirmation: positive ETCO2 and breath sounds checked- equal and bilateral

## 2016-04-15 NOTE — Op Note (Signed)
OPERATIVE REPORT  PREOPERATIVE  DIAGNOSIS: .left breast mass  POSTOPERATIVE DIAGNOSIS: .left breast mass  PROCEDURE: .excision left breast mass  ANESTHESIA:  General  SURGEON: Rochel Brome  MD   INDICATIONS: She had history of abnormal mammogram and upper outer quadrant of the left breast and had previous biopsy with benign fibroadenoma Recent mammogram demonstrated architectural distortion of this site and the radiologist recommended excision. She had preoperative insertion of Kopan's wire. Mammogram images were reviewed.  With the patient on the operating table in the supine position under general anesthesia the dressing was removed from the left breast exposing the Kopan's wire in the upper outer quadrant. The wire was cut 2 cm from the skin. The breast was prepared with ChloraPrep and draped in a sterile manner.  A curvilinear incision was made from the 2:00 to the 3:30 position of the left breast 8 cm from the nipple and carried down through subcutaneous tissues. Multiple small bleeding points were cauterized. The wire was encountered and a portion of tissue surrounding the wire was removed which was approximately 2.5 x 2.5 x 3 cm in dimension. There was some moderate firmness within the tissues. This was submitted for specimen mammogram and routine pathology.  The wound was inspected and several small bleeding points are cauterized.Tissues were infiltrated with half percent Sensorcaine with epinephrine. Subcutaneous tissues were approximated with interrupted 4-0 chromic. The skin was closed with running 4-0 Monocryl suture and Dermabond.  The patient tolerated surgery satisfactorily and was prepared for transfer to the recovery room.   Rochel Brome M.D.

## 2016-04-16 LAB — SURGICAL PATHOLOGY

## 2016-04-16 NOTE — Anesthesia Postprocedure Evaluation (Signed)
Anesthesia Post Note  Patient: SOLYMAR GRACE  Procedure(s) Performed: Procedure(s) (LRB): BREAST LUMPECTOMY WITH NEEDLE LOCALIZATION (Left)  Patient location during evaluation: PACU Anesthesia Type: General Level of consciousness: awake and alert Pain management: pain level controlled Vital Signs Assessment: post-procedure vital signs reviewed and stable Respiratory status: spontaneous breathing, nonlabored ventilation, respiratory function stable and patient connected to nasal cannula oxygen Cardiovascular status: blood pressure returned to baseline and stable Postop Assessment: no signs of nausea or vomiting Anesthetic complications: no     Last Vitals:  Vitals:   04/15/16 1212 04/15/16 1226  BP: 127/79 135/63  Pulse: 80 80  Resp: 19 18  Temp: 36.4 C 36.4 C    Last Pain:  Vitals:   04/16/16 0906  TempSrc:   PainSc: 4                  Molli Barrows

## 2016-05-12 ENCOUNTER — Ambulatory Visit: Payer: Self-pay | Admitting: Pharmacist

## 2016-06-07 ENCOUNTER — Encounter: Payer: Self-pay | Admitting: Family

## 2016-06-07 ENCOUNTER — Ambulatory Visit: Payer: Self-pay | Admitting: Family

## 2016-06-07 VITALS — BP 130/80 | HR 108 | Temp 98.0°F

## 2016-06-07 DIAGNOSIS — R0602 Shortness of breath: Secondary | ICD-10-CM

## 2016-06-07 DIAGNOSIS — J209 Acute bronchitis, unspecified: Secondary | ICD-10-CM

## 2016-06-07 MED ORDER — METHYLPREDNISOLONE 4 MG PO TBPK: ORAL_TABLET | ORAL | 0 refills | Status: DC

## 2016-06-07 MED ORDER — HYDROCOD POLST-CPM POLST ER 10-8 MG/5ML PO SUER: 5.0000 mL | Freq: Two times a day (BID) | ORAL | 0 refills | Status: DC | PRN

## 2016-06-07 MED ORDER — LEVOFLOXACIN 500 MG PO TABS
500.0000 mg | ORAL_TABLET | Freq: Every day | ORAL | 0 refills | Status: DC
Start: 2016-06-07 — End: 2016-10-05

## 2016-06-07 MED ORDER — DEXAMETHASONE SODIUM PHOSPHATE 10 MG/ML IJ SOLN
10.0000 mg | Freq: Once | INTRAMUSCULAR | Status: AC
  Administered 2016-06-07: 10 mg via INTRAMUSCULAR

## 2016-06-07 NOTE — Progress Notes (Signed)
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 and she has been reacting to it, today is sob, cough is barking, and is wheezing, used xopenex about 3 hours prior to arrival, still coughing a lot, states the mucus has turned a dark yellow/brown, no fever/chills, sx for a few weeks since they increased the use of the cleaner  O: vitals wnl, nad, tms clear, throat wnl, neck supple no lymph, lungs c t a, cv rrr, pt has constant bronchitic barking cough  A: acute bronchitis  P: duoneb, decadron 10mg  IM in clinic, rx for medrol dose pack, levaquin 500mg  qd x 7 d, tussionex 125ml nr 54ml q 12h prn cough, pt to return if worsening, notify employee health (Will), pt has already talked to [employer] supervisor Ron

## 2016-08-30 DIAGNOSIS — E7800 Pure hypercholesterolemia, unspecified: Secondary | ICD-10-CM | POA: Diagnosis not present

## 2016-08-30 DIAGNOSIS — E119 Type 2 diabetes mellitus without complications: Secondary | ICD-10-CM | POA: Diagnosis not present

## 2016-09-03 DIAGNOSIS — Z0000 Encounter for general adult medical examination without abnormal findings: Secondary | ICD-10-CM | POA: Diagnosis not present

## 2016-09-03 DIAGNOSIS — IMO0001 Reserved for inherently not codable concepts without codable children: Secondary | ICD-10-CM | POA: Insufficient documentation

## 2016-09-03 DIAGNOSIS — G43009 Migraine without aura, not intractable, without status migrainosus: Secondary | ICD-10-CM | POA: Diagnosis not present

## 2016-09-03 DIAGNOSIS — E7800 Pure hypercholesterolemia, unspecified: Secondary | ICD-10-CM | POA: Diagnosis not present

## 2016-09-03 DIAGNOSIS — E119 Type 2 diabetes mellitus without complications: Secondary | ICD-10-CM | POA: Diagnosis not present

## 2016-09-20 ENCOUNTER — Other Ambulatory Visit: Payer: Self-pay | Admitting: Family Medicine

## 2016-09-20 DIAGNOSIS — Z1231 Encounter for screening mammogram for malignant neoplasm of breast: Secondary | ICD-10-CM

## 2016-09-30 ENCOUNTER — Other Ambulatory Visit: Payer: Self-pay | Admitting: Family Medicine

## 2016-09-30 ENCOUNTER — Ambulatory Visit
Admission: RE | Admit: 2016-09-30 | Discharge: 2016-09-30 | Disposition: A | Discharge status: Home / Self Care | Payer: 59 | Source: Ambulatory Visit | Attending: Family Medicine | Admitting: Family Medicine

## 2016-09-30 DIAGNOSIS — R922 Inconclusive mammogram: Secondary | ICD-10-CM | POA: Diagnosis not present

## 2016-09-30 DIAGNOSIS — N6489 Other specified disorders of breast: Secondary | ICD-10-CM | POA: Diagnosis not present

## 2016-09-30 DIAGNOSIS — N6002 Solitary cyst of left breast: Secondary | ICD-10-CM | POA: Diagnosis not present

## 2016-09-30 DIAGNOSIS — R928 Other abnormal and inconclusive findings on diagnostic imaging of breast: Secondary | ICD-10-CM

## 2016-09-30 DIAGNOSIS — N6320 Unspecified lump in the left breast, unspecified quadrant: Secondary | ICD-10-CM | POA: Diagnosis present

## 2016-09-30 DIAGNOSIS — Z1231 Encounter for screening mammogram for malignant neoplasm of breast: Secondary | ICD-10-CM

## 2016-10-05 ENCOUNTER — Ambulatory Visit: Payer: Self-pay | Admitting: Physician Assistant

## 2016-10-05 ENCOUNTER — Encounter: Payer: Self-pay | Admitting: Physician Assistant

## 2016-10-05 VITALS — BP 120/74 | HR 117 | Temp 98.4°F

## 2016-10-05 DIAGNOSIS — J4530 Mild persistent asthma, uncomplicated: Secondary | ICD-10-CM

## 2016-10-05 MED ORDER — HYDROCOD POLST-CPM POLST ER 10-8 MG/5ML PO SUER: 5.0000 mL | Freq: Two times a day (BID) | ORAL | 0 refills | Status: DC | PRN

## 2016-10-05 MED ORDER — DEXAMETHASONE SODIUM PHOSPHATE 10 MG/ML IJ SOLN
10.0000 mg | Freq: Once | INTRAMUSCULAR | Status: AC
  Administered 2016-10-05: 10 mg via INTRAMUSCULAR

## 2016-10-05 MED ORDER — AZITHROMYCIN 250 MG PO TABS: ORAL_TABLET | ORAL | 0 refills | Status: DC

## 2016-10-05 MED ORDER — PREDNISONE 10 MG (48) PO TBPK: ORAL_TABLET | ORAL | 0 refills | Status: DC

## 2016-10-05 NOTE — Progress Notes (Signed)
S: C/o cough and congestion with wheezing and chest pain, chest is sore from coughing, denies fever, chills,  cough is dry and hacking; keeping pt awake at night; using her neb machine and her inhalers;  denies cardiac type chest pain or sob, v/d, abd pain Remainder ros neg  O: vitals wnl, nad, tms clear, throat injected, neck supple no lymph, lungs c t a, cv rrr, neuro intact, cough is harsh and barking  A:  Acute bronchitis   P:  rx medication: zpack, decadron 10mg  Im given in clinic, sterapred ds 10mg  12 d dose pack to start tomorrow or next day, tussionex 139ml nr 5 ml q 12 h prn, use otc meds, tylenol or motrin as needed for fever/chills, return if not better in 3 -5 days, return earlier if worsening

## 2017-01-03 DIAGNOSIS — E119 Type 2 diabetes mellitus without complications: Secondary | ICD-10-CM | POA: Diagnosis not present

## 2017-01-03 DIAGNOSIS — E7800 Pure hypercholesterolemia, unspecified: Secondary | ICD-10-CM | POA: Diagnosis not present

## 2017-01-05 NOTE — Patient Outreach (Signed)
[place] [facility]) Care Management  01/05/2017  SONAM WANDEL July 26, 1960 [id number]   I have removed myself from the Care Team for Link to Wellness but will continue to assist her in the [facility] app.   [name], RN, BA, [place], [place] Direct Dial:  [phone]  Fax:  [phone] E-mail: [email or web address] [address] [place], [place]  [address]

## 2017-01-07 DIAGNOSIS — G43009 Migraine without aura, not intractable, without status migrainosus: Secondary | ICD-10-CM | POA: Diagnosis not present

## 2017-01-07 DIAGNOSIS — E669 Obesity, unspecified: Secondary | ICD-10-CM | POA: Diagnosis not present

## 2017-01-07 DIAGNOSIS — E7800 Pure hypercholesterolemia, unspecified: Secondary | ICD-10-CM | POA: Diagnosis not present

## 2017-01-07 DIAGNOSIS — E119 Type 2 diabetes mellitus without complications: Secondary | ICD-10-CM | POA: Diagnosis not present

## 2017-02-07 ENCOUNTER — Encounter: Payer: Self-pay | Admitting: *Deleted

## 2017-02-07 ENCOUNTER — Telehealth: Payer: Self-pay | Admitting: Gastroenterology

## 2017-02-07 DIAGNOSIS — Z86010 Personal history of colonic polyps: Secondary | ICD-10-CM

## 2017-02-07 NOTE — Telephone Encounter (Signed)
Patient due for repeat colonoscopy 03/19/14 due in 3 years. Please call and schedule.

## 2017-02-10 ENCOUNTER — Other Ambulatory Visit: Payer: Self-pay

## 2017-02-10 DIAGNOSIS — Z86010 Personal history of colonic polyps: Secondary | ICD-10-CM

## 2017-02-10 MED ORDER — NA SULFATE-K SULFATE-MG SULF 17.5-3.13-1.6 GM/177ML PO SOLN: 1.0000 | Freq: Once | ORAL | 0 refills | Status: AC

## 2017-02-10 NOTE — Telephone Encounter (Signed)
Gastroenterology Pre-Procedure Review  Request Date: 2/5 Requesting Physician: Dr. Allen Norris  PATIENT REVIEW QUESTIONS: The patient responded to the following health history questions as indicated:    1. Are you having any GI issues? no 2. Do you have a personal history of Polyps? yes (Wohl removed 03/2014) 3. Do you have a family history of Colon Cancer or Polyps? no 4. 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 Joint replacements in the past 12 months?no 6. Major health problems in the past 3 months?no 7. Any artificial heart valves, MVP, or defibrillator?no    MEDICATIONS & ALLERGIES:    Patient reports the following regarding taking any anticoagulation/antiplatelet therapy:   Plavix, Coumadin, Eliquis, Xarelto, Lovenox, Pradaxa, Brilinta, or Effient? no Aspirin? no  Patient confirms/reports the following medications:  Current Outpatient Medications  Medication Sig Dispense Refill  . ACCU-CHEK FASTCLIX LANCETS MISC   5  . ACCU-CHEK GUIDE test strip   11  . azithromycin (ZITHROMAX Z-PAK) 250 MG tablet 2 pills today then 1 pill a day for 4 days (Patient not taking: Reported on 10/05/2016) 6 each 0  . budesonide-formoterol (SYMBICORT) 160-4.5 MCG/ACT inhaler Inhale 1 puff into the lungs 2 (two) times daily.    . chlorpheniramine-HYDROcodone (TUSSIONEX PENNKINETIC ER) 10-8 MG/5ML SUER Take 5 mLs by mouth every 12 (twelve) hours as needed for cough. (Patient not taking: Reported on 10/05/2016) 150 mL 0  . EPINEPHrine 1 MG/ML KIT Inject 0.3 mg as directed as needed (allergic reacttion). Take 0.83m once a day as needed    . glimepiride (AMARYL) 4 MG tablet Take 4 mg by mouth 2 (two) times daily. Patient verified she takes 2x daily    . HYDROcodone-acetaminophen (NORCO) 5-325 MG tablet Take 1-2 tablets by mouth every 4 (four) hours as needed for moderate pain. 12 tablet 0  . ibuprofen (ADVIL,MOTRIN) 200 MG tablet Take 600 mg by mouth every 6 (six) hours as needed for mild pain or moderate  pain.    .Marland Kitchenlevalbuterol (XOPENEX) 1.25 MG/0.5ML nebulizer solution Take 1.25 mg by nebulization every 4 (four) hours as needed for wheezing or shortness of breath.    . loratadine (CLARITIN) 10 MG tablet Take 10 mg by mouth at bedtime.    .Marland Kitchenlosartan (COZAAR) 25 MG tablet Take 25 mg by mouth at bedtime. Reported on 03/07/2015    . montelukast (SINGULAIR) 10 MG tablet Take 10 mg by mouth at bedtime.    . Omega-3 Fatty Acids (FISH OIL CONCENTRATE) 300 MG CAPS Take 1 tablet by mouth 3 (three) times a week.     . predniSONE (STERAPRED UNI-PAK 48 TAB) 10 MG (48) TBPK tablet Use as directed (Patient not taking: Reported on 10/05/2016) 48 tablet 0  . prochlorperazine (COMPAZINE) 10 MG tablet Take 10 mg by mouth every 6 (six) hours as needed for nausea or vomiting (with migraines).    . SUMAtriptan (IMITREX) 100 MG tablet Take 100 mg by mouth every 2 (two) hours as needed for migraine. Reported on 08/25/2015     No current facility-administered medications for this visit.     Patient confirms/reports the following allergies:  Allergies  Allergen Reactions  . Other Anaphylaxis    Lobster- anaphylaxis. Per pt, she can eat other shellfish  . Penicillins Anaphylaxis    Has patient had a PCN reaction causing immediate rash, facial/tongue/throat swelling, SOB or lightheadedness with hypotension: Yes Has patient had a PCN reaction causing severe rash involving mucus membranes or skin necrosis: No Has patient had a PCN reaction  that required hospitalization Yes Has patient had a PCN reaction occurring within the last 10 years: No If all of the above answers are "NO", then may proceed with Cephalosporin use.  Vancomycin  . Vancomycin Anaphylaxis  . Metformin Nausea Only  . Metformin And Related Nausea And Vomiting    "intolerance"  . Propranolol     Other reaction(s): Other (See Comments) Lowers BP  . Sulfamethoxazole-Trimethoprim Nausea Only  . Monascus Purpureus Went Yeast Rash  . Red Yeast Rice   [Cholestin] Rash    No orders of the defined types were placed in this encounter.   AUTHORIZATION INFORMATION Primary Insurance: 1D#: Group #:  Secondary Insurance: 1D#: Group #:  SCHEDULE INFORMATION: Date: 2/5 Time: Location: [place]

## 2017-02-24 DIAGNOSIS — J4520 Mild intermittent asthma, uncomplicated: Secondary | ICD-10-CM | POA: Diagnosis not present

## 2017-03-14 ENCOUNTER — Encounter: Payer: Self-pay | Admitting: *Deleted

## 2017-03-15 ENCOUNTER — Ambulatory Visit
Admission: RE | Admit: 2017-03-15 | Discharge: 2017-03-15 | Disposition: A | Discharge status: Home / Self Care | Payer: 59 | Source: Ambulatory Visit | Attending: Gastroenterology | Admitting: Gastroenterology

## 2017-03-15 ENCOUNTER — Ambulatory Visit: Payer: 59 | Admitting: Anesthesiology

## 2017-03-15 ENCOUNTER — Other Ambulatory Visit: Payer: Self-pay

## 2017-03-15 ENCOUNTER — Encounter: Payer: Self-pay | Admitting: *Deleted

## 2017-03-15 ENCOUNTER — Encounter
Admission: RE | Disposition: A | Discharge status: Home / Self Care | Payer: Self-pay | Source: Ambulatory Visit | Attending: Gastroenterology

## 2017-03-15 DIAGNOSIS — Z794 Long term (current) use of insulin: Secondary | ICD-10-CM | POA: Insufficient documentation

## 2017-03-15 DIAGNOSIS — Z860100 Personal history of colon polyps, unspecified: Secondary | ICD-10-CM

## 2017-03-15 DIAGNOSIS — K635 Polyp of colon: Secondary | ICD-10-CM

## 2017-03-15 DIAGNOSIS — E119 Type 2 diabetes mellitus without complications: Secondary | ICD-10-CM | POA: Insufficient documentation

## 2017-03-15 DIAGNOSIS — E7800 Pure hypercholesterolemia, unspecified: Secondary | ICD-10-CM | POA: Insufficient documentation

## 2017-03-15 DIAGNOSIS — Z79899 Other long term (current) drug therapy: Secondary | ICD-10-CM | POA: Insufficient documentation

## 2017-03-15 DIAGNOSIS — Z1211 Encounter for screening for malignant neoplasm of colon: Secondary | ICD-10-CM | POA: Diagnosis not present

## 2017-03-15 DIAGNOSIS — K5730 Diverticulosis of large intestine without perforation or abscess without bleeding: Secondary | ICD-10-CM

## 2017-03-15 DIAGNOSIS — Z800 Family history of malignant neoplasm of digestive organs: Secondary | ICD-10-CM | POA: Diagnosis not present

## 2017-03-15 DIAGNOSIS — D125 Benign neoplasm of sigmoid colon: Secondary | ICD-10-CM

## 2017-03-15 DIAGNOSIS — G43909 Migraine, unspecified, not intractable, without status migrainosus: Secondary | ICD-10-CM | POA: Diagnosis not present

## 2017-03-15 DIAGNOSIS — K5790 Diverticulosis of intestine, part unspecified, without perforation or abscess without bleeding: Secondary | ICD-10-CM | POA: Diagnosis not present

## 2017-03-15 DIAGNOSIS — J45909 Unspecified asthma, uncomplicated: Secondary | ICD-10-CM | POA: Insufficient documentation

## 2017-03-15 HISTORY — PX: COLONOSCOPY WITH PROPOFOL: SHX5780

## 2017-03-15 LAB — GLUCOSE, CAPILLARY: GLUCOSE-CAPILLARY: 204 mg/dL — AB (ref 65–99)

## 2017-03-15 SURGERY — COLONOSCOPY WITH PROPOFOL
Anesthesia: General

## 2017-03-15 MED ORDER — MIDAZOLAM HCL 2 MG/2ML IJ SOLN
INTRAMUSCULAR | Status: DC | PRN
  Administered 2017-03-15 (×2): 1 mg via INTRAVENOUS

## 2017-03-15 MED ORDER — ONDANSETRON HCL 4 MG/2ML IJ SOLN
INTRAMUSCULAR | Status: DC | PRN
  Administered 2017-03-15: 4 mg via INTRAVENOUS

## 2017-03-15 MED ORDER — SODIUM CHLORIDE 0.9 % IV SOLN
INTRAVENOUS | Status: DC
  Administered 2017-03-15 (×2): via INTRAVENOUS

## 2017-03-15 MED ORDER — LIDOCAINE HCL (CARDIAC) 20 MG/ML IV SOLN
INTRAVENOUS | Status: DC | PRN
  Administered 2017-03-15: 50 mg via INTRAVENOUS

## 2017-03-15 MED ORDER — PROPOFOL 500 MG/50ML IV EMUL
INTRAVENOUS | Status: AC
  Filled 2017-03-15: qty 300

## 2017-03-15 MED ORDER — PROPOFOL 10 MG/ML IV BOLUS
INTRAVENOUS | Status: AC
  Filled 2017-03-15: qty 20

## 2017-03-15 MED ORDER — PROPOFOL 500 MG/50ML IV EMUL
INTRAVENOUS | Status: DC | PRN
  Administered 2017-03-15: 150 ug/kg/min via INTRAVENOUS

## 2017-03-15 MED ORDER — MIDAZOLAM HCL 2 MG/2ML IJ SOLN
INTRAMUSCULAR | Status: AC
  Filled 2017-03-15: qty 2

## 2017-03-15 MED ORDER — PROPOFOL 10 MG/ML IV BOLUS
INTRAVENOUS | Status: DC | PRN
  Administered 2017-03-15 (×2): 20 mg via INTRAVENOUS

## 2017-03-15 NOTE — OR Nursing (Signed)
Dr. Randa Lynn informed of this am FBG of 204, OK to proceed.

## 2017-03-15 NOTE — Anesthesia Post-op Follow-up Note (Signed)
Anesthesia QCDR form completed.        

## 2017-03-15 NOTE — Anesthesia Postprocedure Evaluation (Signed)
Anesthesia Post Note  Patient: Virginia Rogers  Procedure(s) Performed: COLONOSCOPY WITH PROPOFOL (N/A )  Patient location during evaluation: Endoscopy Anesthesia Type: General Level of consciousness: awake and alert and oriented Pain management: pain level controlled Vital Signs Assessment: post-procedure vital signs reviewed and stable Respiratory status: spontaneous breathing, nonlabored ventilation and respiratory function stable Cardiovascular status: blood pressure returned to baseline and stable Postop Assessment: no signs of nausea or vomiting Anesthetic complications: no     Last Vitals:  Vitals:   03/15/17 1035 03/15/17 1045  BP: 105/86 111/81  Pulse: 79 78  Resp: 14 18  Temp:    SpO2: 100% 100%    Last Pain:  Vitals:   03/15/17 1015  TempSrc: Tympanic                 Lindamarie Maclachlan

## 2017-03-15 NOTE — Anesthesia Preprocedure Evaluation (Signed)
Anesthesia Evaluation  Patient identified by MRN, date of birth, ID band Patient awake    Reviewed: Allergy & Precautions, NPO status , Patient's Chart, lab work & pertinent test results  History of Anesthesia Complications Negative for: history of anesthetic complications  Airway Mallampati: III  TM Distance: >3 FB Neck ROM: Full    Dental no notable dental hx.    Pulmonary asthma (mild intermittent) , neg sleep apnea,    breath sounds clear to auscultation- rhonchi (-) wheezing      Cardiovascular Exercise Tolerance: Good (-) hypertension(-) CAD, (-) Past MI, (-) Cardiac Stents and (-) CABG  Rhythm:Regular Rate:Normal - Systolic murmurs and - Diastolic murmurs    Neuro/Psych  Headaches, negative psych ROS   GI/Hepatic Neg liver ROS, GERD  ,  Endo/Other  diabetes, Insulin Dependent  Renal/GU negative Renal ROS     Musculoskeletal negative musculoskeletal ROS (+)   Abdominal (+) + obese,   Peds  Hematology negative hematology ROS (+)   Anesthesia Other Findings Past Medical History: No date: Allergy No date: Asthma     Comment:  triggered by odors 2014: Cancer ([place])     Comment:  polyp in colon No date: Diabetes mellitus without complication ([place]) No date: GERD (gastroesophageal reflux disease) 1976: H/O scarlet fever No date: Headache     Comment:  migraines, triggered by weather changes 07/28/2015: Pure hypercholesterolemia   Reproductive/Obstetrics                             Anesthesia Physical Anesthesia Plan  ASA: II  Anesthesia Plan: General   Post-op Pain Management:    Induction: Intravenous  PONV Risk Score and Plan: 2 and Propofol infusion  Airway Management Planned: Natural Airway  Additional Equipment:   Intra-op Plan:   Post-operative Plan:   Informed Consent: I have reviewed the patients History and Physical, chart, labs and discussed the procedure  including the risks, benefits and alternatives for the proposed anesthesia with the patient or authorized representative who has indicated his/her understanding and acceptance.   Dental advisory given  Plan Discussed with: CRNA and Anesthesiologist  Anesthesia Plan Comments:         Anesthesia Quick Evaluation

## 2017-03-15 NOTE — Op Note (Signed)
[facility] Gastroenterology Patient Name: Virginia Rogers Procedure Date: 03/15/2017 9:36 AM MRN: [id number] Account #: [id number] Date of Birth: 1960-08-08 Admit Type: Outpatient Age: 57 Room: [facility] ENDO ROOM 4 Gender: Female Note Status: Finalized Procedure:            Colonoscopy Indications:          Screening for colorectal malignant neoplasm, High risk                        colon cancer surveillance: Personal history of colonic                        polyps Providers:            Jyden Kromer B. Bonna Gains MD, MD Referring MD:         Dion Body (Referring MD) Medicines:            Monitored Anesthesia Care Complications:        No immediate complications. Procedure:            Pre-Anesthesia Assessment:                       - ASA Grade Assessment: II - A patient with mild                        systemic disease.                       - Prior to the procedure, a History and Physical was                        performed, and patient medications, allergies and                        sensitivities were reviewed. The patient's tolerance of                        previous anesthesia was reviewed.                       - The risks and benefits of the procedure and the                        sedation options and risks were discussed with the                        patient. All questions were answered and informed                        consent was obtained.                       - Patient identification and proposed procedure were                        verified prior to the procedure by the physician, the                        nurse, the anesthesiologist, the anesthetist and the  technician. The procedure was verified in the procedure                        room.                       After obtaining informed consent, the colonoscope was                        passed under direct vision. Throughout the procedure,                        the  patient's blood pressure, pulse, and oxygen                        saturations were monitored continuously. The                        Colonoscope was introduced through the anus and                        advanced to the the cecum, identified by appendiceal                        orifice and ileocecal valve. The colonoscopy was                        performed with ease. The patient tolerated the                        procedure well. The quality of the bowel preparation                        was good. Findings:      The perianal and digital rectal examinations were normal.      Two sessile polyps were found in the sigmoid colon. The polyps were 3 to       4 mm in size. These polyps were removed with a cold biopsy forceps.       Resection and retrieval were complete.      Multiple small-mouthed diverticula were found in the sigmoid colon.      The exam was otherwise without abnormality.      The rectum, sigmoid colon, descending colon, transverse colon, ascending       colon and cecum appeared normal.      The retroflexed view of the distal rectum and anal verge was normal and       showed no anal or rectal abnormalities. Impression:           - Two 3 to 4 mm polyps in the sigmoid colon, removed                        with a cold biopsy forceps. Resected and retrieved.                       - Diverticulosis in the sigmoid colon.                       - The examination was otherwise normal.                       -  The rectum, sigmoid colon, descending colon,                        transverse colon, ascending colon and cecum are normal.                       - The distal rectum and anal verge are normal on                        retroflexion view. Recommendation:       - Discharge patient to home (with escort).                       - Advance diet as tolerated.                       - Continue present medications.                       - Await pathology results.                       -  Repeat colonoscopy in 5 years for surveillance.                       - The findings and recommendations were discussed with                        the patient.                       - The findings and recommendations were discussed with                        the patient's family.                       - Return to primary care physician as previously                        scheduled.                       - High fiber diet. Procedure Code(s):    --- Professional ---                       [phone], Colonoscopy, flexible; with biopsy, single or                        multiple Diagnosis Code(s):    --- Professional ---                       Z12.11, Encounter for screening for malignant neoplasm                        of colon                       Z86.010, Personal history of colonic polyps                       D12.5, Benign neoplasm of sigmoid colon  K57.30, Diverticulosis of large intestine without                        perforation or abscess without bleeding CPT copyright 2016 American Medical Association. All rights reserved. The codes documented in this report are preliminary and upon coder review may  be revised to meet current compliance requirements.  Vonda Antigua, MD Margretta Sidle B. Bonna Gains MD, MD 03/15/2017 10:14:51 AM This report has been signed electronically. Number of Addenda: 0 Note Initiated On: 03/15/2017 9:36 AM Scope Withdrawal Time: 0 hours 15 minutes 6 seconds  Total Procedure Duration: 0 hours 21 minutes 27 seconds  Estimated Blood Loss: Estimated blood loss: none.      [facility]

## 2017-03-15 NOTE — H&P (Signed)
Vonda Antigua, MD [address], [place], [place], [place], [address] [address], [place], [place], [place], [address] Phone: [phone]  Fax: [phone]  Primary Care Physician:  Dion Body, MD   Pre-Procedure History & Physical: HPI:  Virginia Rogers is a 57 y.o. female is here for a colonoscopy.   Past Medical History:  Diagnosis Date  . Allergy   . Asthma    triggered by odors  . Cancer ([place]) 2014   polyp in colon  . Diabetes mellitus without complication ([place])   . GERD (gastroesophageal reflux disease)   . H/O scarlet fever 1976  . Headache    migraines, triggered by weather changes  . Pure hypercholesterolemia 07/28/2015    Past Surgical History:  Procedure Laterality Date  . ABDOMINAL HYSTERECTOMY  2009  . AUGMENTATION MAMMAPLASTY Bilateral 1996  . BREAST BIOPSY Left 09/15/2015   hyalinized fibroadenoma, fibrocystic changes  . BREAST CYST ASPIRATION Right 2003  . BREAST ENHANCEMENT SURGERY Bilateral 1996  . BREAST EXCISIONAL BIOPSY Right 1997   neg  . BREAST EXCISIONAL BIOPSY Left 04/15/2016   Needle Placement  . BREAST LUMPECTOMY WITH NEEDLE LOCALIZATION Left 04/15/2016   Procedure: BREAST LUMPECTOMY WITH NEEDLE LOCALIZATION;  Surgeon: Leonie Green, MD;  Location: [facility] ORS;  Service: General;  Laterality: Left;  . CESAREAN SECTION  1983  . dislocated right shoulder Right 2007   block to put shoulder back in place  . OVARIAN CYST REMOVAL  2009  . ROTATOR CUFF REPAIR Bilateral [id number]  . TONSILLECTOMY AND ADENOIDECTOMY  2008   [facility]    Prior to Admission medications   Medication Sig Start Date End Date Taking? Authorizing Provider  ACCU-CHEK FASTCLIX LANCETS [place]  03/29/16   [provider]  ACCU-CHEK GUIDE test strip  05/27/16   [provider]  azithromycin (ZITHROMAX Z-PAK) 250 MG tablet 2 pills today then 1 pill a day for 4 days Patient not taking: Reported on 10/05/2016 10/05/16   Versie Starks, PA-C    budesonide-formoterol (SYMBICORT) 160-4.5 MCG/ACT inhaler Inhale 1 puff into the lungs 2 (two) times daily. 03/09/16   [provider]  chlorpheniramine-HYDROcodone (TUSSIONEX PENNKINETIC ER) 10-8 MG/5ML SUER Take 5 mLs by mouth every 12 (twelve) hours as needed for cough. Patient not taking: Reported on 10/05/2016 10/05/16   Versie Starks, PA-C  EPINEPHrine 1 MG/ML KIT Inject 0.3 mg as directed as needed (allergic reacttion). Take 0.93m once a day as needed    [provider]  glimepiride (AMARYL) 4 MG tablet Take 4 mg by mouth 2 (two) times daily. Patient verified she takes 2x daily    [provider]  HYDROcodone-acetaminophen (NORCO) 5-325 MG tablet Take 1-2 tablets by mouth every 4 (four) hours as needed for moderate pain. 04/15/16   SLeonie Green MD  ibuprofen (ADVIL,MOTRIN) 200 MG tablet Take 600 mg by mouth every 6 (six) hours as needed for mild pain or moderate pain.    [provider]  levalbuterol (XOPENEX) 1.25 MG/0.5ML nebulizer solution Take 1.25 mg by nebulization every 4 (four) hours as needed for wheezing or shortness of breath.    [provider]  loratadine (CLARITIN) 10 MG tablet Take 10 mg by mouth at bedtime.    [provider]  losartan (COZAAR) 25 MG tablet Take 25 mg by mouth at bedtime. Reported on 03/07/2015    [provider]  montelukast (SINGULAIR) 10 MG tablet Take 10 mg by mouth at bedtime.    [provider]  Omega-3 Fatty Acids (FISH OIL CONCENTRATE) 300 MG CAPS Take 1 tablet by mouth 3 (three) times a week.     [provider]  predniSONE (STERAPRED UNI-PAK 48 TAB) 10 MG (48) TBPK tablet Use as directed Patient not taking: Reported on 10/05/2016 10/05/16   Versie Starks, PA-C  prochlorperazine (COMPAZINE) 10 MG tablet Take 10 mg by mouth every 6 (six) hours as needed for nausea or vomiting (with migraines).    [provider]  SUMAtriptan (IMITREX) 100 MG tablet Take 100  mg by mouth every 2 (two) hours as needed for migraine. Reported on 08/25/2015    [provider]    Allergies as of 02/10/2017 - Review Complete 10/05/2016  Allergen Reaction Noted  . Other Anaphylaxis 05/12/2015  . Penicillins Anaphylaxis 06/03/2014  . Vancomycin Anaphylaxis 03/07/2015  . Metformin Nausea Only 03/07/2015  . Metformin and related Nausea And Vomiting 06/03/2014  . Propranolol  03/07/2015  . Sulfamethoxazole-trimethoprim Nausea Only 07/28/2015  . Monascus purpureus went yeast Rash 09/19/2015  . Red yeast rice  [cholestin] Rash 09/19/2015    Family History  Problem Relation Age of Onset  . Arthritis Mother   . Diabetes Mother   . Heart disease Father   . Heart disease Brother   . Breast cancer Sister 55  . Cancer Maternal Uncle   . Colon cancer Maternal Grandmother 82    Social History   Socioeconomic History  . Marital status: Married    Spouse name: Not on file  . Number of children: Not on file  . Years of education: Not on file  . Highest education level: Not on file  Social Needs  . Financial resource strain: Not on file  . Food insecurity - worry: Not on file  . Food insecurity - inability: Not on file  . Transportation needs - medical: Not on file  . Transportation needs - non-medical: Not on file  Occupational History  . Not on file  Tobacco Use  . Smoking status: Never Smoker  . Smokeless tobacco: Never Used  Substance and Sexual Activity  . Alcohol use: Yes    Alcohol/week: 0.0 oz    Comment: on her birthday and one other time per year  . Drug use: No  . Sexual activity: Yes    Partners: Male    Birth control/protection: Surgical  Other Topics Concern  . Not on file  Social History Narrative  . Not on file    Review of Systems: See HPI, otherwise negative ROS  Physical Exam: There were no vitals taken for this visit. General:   Alert,  pleasant and cooperative in NAD Head:  Normocephalic and atraumatic. Neck:  Supple;  no masses or thyromegaly. Lungs:  Clear throughout to auscultation, normal respiratory effort.    Heart:  +S1, +S2, Regular rate and rhythm, No edema. Abdomen:  Soft, nontender and nondistended. Normal bowel sounds, without guarding, and without rebound.   Neurologic:  Alert and  oriented x4;  grossly normal neurologically.  Impression/Plan: Virginia Rogers is here for a colonoscopy to be performed for polyp surveillance.  Risks, benefits, limitations, and alternatives regarding  colonoscopy have been reviewed with the patient.  Questions have been answered.  All parties agreeable.   Virgel Manifold, MD  03/15/2017, 7:49 AM

## 2017-03-15 NOTE — Transfer of Care (Signed)
Immediate Anesthesia Transfer of Care Note  Patient: Virginia Rogers  Procedure(s) Performed: COLONOSCOPY WITH PROPOFOL (N/A )  Patient Location: PACU  Anesthesia Type:MAC  Level of Consciousness: awake  Airway & Oxygen Therapy: Patient Spontanous Breathing  Post-op Assessment: Report given to RN  Post vital signs: stable  Last Vitals:  Vitals:   03/15/17 1015 03/15/17 1016  BP:  119/79  Pulse: 90   Resp: 20   Temp: (!) 35.8 C   SpO2: 99%     Last Pain:  Vitals:   03/15/17 1015  TempSrc: Tympanic         Complications: No apparent anesthesia complications

## 2017-03-16 ENCOUNTER — Encounter: Payer: Self-pay | Admitting: Gastroenterology

## 2017-03-16 LAB — SURGICAL PATHOLOGY

## 2017-03-17 ENCOUNTER — Encounter: Payer: Self-pay | Admitting: Gastroenterology

## 2017-03-24 DIAGNOSIS — Z794 Long term (current) use of insulin: Secondary | ICD-10-CM | POA: Diagnosis not present

## 2017-03-24 DIAGNOSIS — E119 Type 2 diabetes mellitus without complications: Secondary | ICD-10-CM | POA: Diagnosis not present

## 2017-03-25 DIAGNOSIS — E119 Type 2 diabetes mellitus without complications: Secondary | ICD-10-CM | POA: Diagnosis not present

## 2017-03-25 DIAGNOSIS — Z794 Long term (current) use of insulin: Secondary | ICD-10-CM | POA: Diagnosis not present

## 2017-04-04 DIAGNOSIS — E1169 Type 2 diabetes mellitus with other specified complication: Secondary | ICD-10-CM | POA: Diagnosis not present

## 2017-04-04 DIAGNOSIS — E785 Hyperlipidemia, unspecified: Secondary | ICD-10-CM | POA: Diagnosis not present

## 2017-04-04 DIAGNOSIS — E119 Type 2 diabetes mellitus without complications: Secondary | ICD-10-CM | POA: Diagnosis not present

## 2017-04-04 DIAGNOSIS — Z794 Long term (current) use of insulin: Secondary | ICD-10-CM | POA: Diagnosis not present

## 2017-04-14 DIAGNOSIS — M19011 Primary osteoarthritis, right shoulder: Secondary | ICD-10-CM | POA: Diagnosis not present

## 2017-04-14 DIAGNOSIS — M25819 Other specified joint disorders, unspecified shoulder: Secondary | ICD-10-CM | POA: Diagnosis not present

## 2017-05-02 ENCOUNTER — Other Ambulatory Visit: Payer: Self-pay

## 2017-05-02 ENCOUNTER — Encounter: Payer: Self-pay | Admitting: Physical Therapy

## 2017-05-02 ENCOUNTER — Ambulatory Visit: Payer: 59 | Attending: Orthopedic Surgery | Admitting: Physical Therapy

## 2017-05-02 DIAGNOSIS — G8929 Other chronic pain: Secondary | ICD-10-CM | POA: Insufficient documentation

## 2017-05-02 DIAGNOSIS — M6281 Muscle weakness (generalized): Secondary | ICD-10-CM | POA: Insufficient documentation

## 2017-05-02 DIAGNOSIS — R293 Abnormal posture: Secondary | ICD-10-CM | POA: Insufficient documentation

## 2017-05-02 DIAGNOSIS — M25511 Pain in right shoulder: Secondary | ICD-10-CM | POA: Insufficient documentation

## 2017-05-02 NOTE — Addendum Note (Signed)
Addended by: Collie Siad D on: 05/02/2017 11:20 AM   Modules accepted: Orders
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
## 2017-05-05 ENCOUNTER — Ambulatory Visit: Payer: 59

## 2017-05-06 ENCOUNTER — Ambulatory Visit: Payer: 59 | Admitting: Physical Therapy

## 2017-05-10 ENCOUNTER — Ambulatory Visit: Payer: 59 | Admitting: Physical Therapy

## 2017-05-11 ENCOUNTER — Ambulatory Visit: Payer: 59 | Attending: Orthopedic Surgery

## 2017-05-11 DIAGNOSIS — R293 Abnormal posture: Secondary | ICD-10-CM | POA: Diagnosis not present

## 2017-05-11 DIAGNOSIS — G8929 Other chronic pain: Secondary | ICD-10-CM | POA: Insufficient documentation

## 2017-05-11 DIAGNOSIS — M25511 Pain in right shoulder: Secondary | ICD-10-CM | POA: Diagnosis not present

## 2017-05-11 DIAGNOSIS — M6281 Muscle weakness (generalized): Secondary | ICD-10-CM | POA: Diagnosis not present
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
## 2017-05-13 ENCOUNTER — Ambulatory Visit: Payer: 59 | Admitting: Physical Therapy

## 2017-05-17 ENCOUNTER — Ambulatory Visit: Payer: 59 | Admitting: Physical Therapy

## 2017-05-18 ENCOUNTER — Ambulatory Visit: Payer: 59

## 2017-05-18 DIAGNOSIS — R293 Abnormal posture: Secondary | ICD-10-CM | POA: Diagnosis not present

## 2017-05-18 DIAGNOSIS — G8929 Other chronic pain: Secondary | ICD-10-CM

## 2017-05-18 DIAGNOSIS — E119 Type 2 diabetes mellitus without complications: Secondary | ICD-10-CM | POA: Diagnosis not present

## 2017-05-18 DIAGNOSIS — M25511 Pain in right shoulder: Principal | ICD-10-CM

## 2017-05-18 DIAGNOSIS — E7800 Pure hypercholesterolemia, unspecified: Secondary | ICD-10-CM | POA: Diagnosis not present

## 2017-05-18 DIAGNOSIS — M6281 Muscle weakness (generalized): Secondary | ICD-10-CM
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
## 2017-05-20 ENCOUNTER — Ambulatory Visit: Payer: 59 | Admitting: Physical Therapy

## 2017-05-24 ENCOUNTER — Ambulatory Visit: Payer: 59 | Admitting: Physical Therapy

## 2017-05-25 DIAGNOSIS — E1169 Type 2 diabetes mellitus with other specified complication: Secondary | ICD-10-CM | POA: Diagnosis not present

## 2017-05-25 DIAGNOSIS — E119 Type 2 diabetes mellitus without complications: Secondary | ICD-10-CM | POA: Diagnosis not present

## 2017-05-25 DIAGNOSIS — E785 Hyperlipidemia, unspecified: Secondary | ICD-10-CM | POA: Diagnosis not present

## 2017-05-25 DIAGNOSIS — E669 Obesity, unspecified: Secondary | ICD-10-CM | POA: Diagnosis not present

## 2017-05-26 ENCOUNTER — Ambulatory Visit: Payer: 59 | Admitting: Physical Therapy

## 2017-05-26 DIAGNOSIS — M19019 Primary osteoarthritis, unspecified shoulder: Secondary | ICD-10-CM | POA: Diagnosis not present

## 2017-05-26 DIAGNOSIS — M25511 Pain in right shoulder: Secondary | ICD-10-CM | POA: Diagnosis not present

## 2017-05-27 ENCOUNTER — Encounter: Payer: Self-pay | Admitting: Physical Therapy

## 2017-05-30 ENCOUNTER — Ambulatory Visit: Payer: 59

## 2017-05-30 DIAGNOSIS — R293 Abnormal posture: Secondary | ICD-10-CM

## 2017-05-30 DIAGNOSIS — M25511 Pain in right shoulder: Secondary | ICD-10-CM | POA: Diagnosis not present

## 2017-05-30 DIAGNOSIS — G8929 Other chronic pain: Secondary | ICD-10-CM | POA: Diagnosis not present

## 2017-05-30 DIAGNOSIS — M6281 Muscle weakness (generalized): Secondary | ICD-10-CM
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
## 2017-05-31 ENCOUNTER — Encounter: Payer: Self-pay | Admitting: Physical Therapy

## 2017-06-01 ENCOUNTER — Ambulatory Visit: Payer: 59

## 2017-06-01 DIAGNOSIS — M6281 Muscle weakness (generalized): Secondary | ICD-10-CM

## 2017-06-01 DIAGNOSIS — G8929 Other chronic pain: Secondary | ICD-10-CM | POA: Diagnosis not present

## 2017-06-01 DIAGNOSIS — M25511 Pain in right shoulder: Principal | ICD-10-CM

## 2017-06-01 DIAGNOSIS — R293 Abnormal posture: Secondary | ICD-10-CM | POA: Diagnosis not present
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
## 2017-06-03 ENCOUNTER — Encounter: Payer: Self-pay | Admitting: Physical Therapy

## 2017-06-06 ENCOUNTER — Ambulatory Visit: Payer: 59

## 2017-06-06 DIAGNOSIS — G8929 Other chronic pain: Secondary | ICD-10-CM | POA: Diagnosis not present

## 2017-06-06 DIAGNOSIS — M6281 Muscle weakness (generalized): Secondary | ICD-10-CM | POA: Diagnosis not present

## 2017-06-06 DIAGNOSIS — R293 Abnormal posture: Secondary | ICD-10-CM | POA: Diagnosis not present

## 2017-06-06 DIAGNOSIS — M25511 Pain in right shoulder: Principal | ICD-10-CM
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
## 2017-06-07 ENCOUNTER — Encounter: Payer: Self-pay | Admitting: Physical Therapy

## 2017-06-08 ENCOUNTER — Ambulatory Visit: Payer: 59 | Attending: Orthopedic Surgery

## 2017-06-08 DIAGNOSIS — M6281 Muscle weakness (generalized): Secondary | ICD-10-CM | POA: Insufficient documentation

## 2017-06-08 DIAGNOSIS — M25511 Pain in right shoulder: Secondary | ICD-10-CM | POA: Insufficient documentation

## 2017-06-08 DIAGNOSIS — G8929 Other chronic pain: Secondary | ICD-10-CM | POA: Insufficient documentation
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
## 2017-06-10 ENCOUNTER — Encounter: Payer: Self-pay | Admitting: Physical Therapy

## 2017-06-13 ENCOUNTER — Ambulatory Visit: Payer: 59

## 2017-06-13 DIAGNOSIS — M6281 Muscle weakness (generalized): Secondary | ICD-10-CM | POA: Diagnosis not present

## 2017-06-13 DIAGNOSIS — M25511 Pain in right shoulder: Principal | ICD-10-CM

## 2017-06-13 DIAGNOSIS — G8929 Other chronic pain: Secondary | ICD-10-CM
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
## 2017-06-15 ENCOUNTER — Ambulatory Visit: Payer: 59 | Admitting: Physical Therapy

## 2017-06-22 ENCOUNTER — Ambulatory Visit: Payer: 59 | Admitting: Physical Therapy

## 2017-06-28 ENCOUNTER — Ambulatory Visit: Payer: 59 | Admitting: Physical Therapy

## 2017-06-29 DIAGNOSIS — E1169 Type 2 diabetes mellitus with other specified complication: Secondary | ICD-10-CM | POA: Diagnosis not present

## 2017-06-29 DIAGNOSIS — E785 Hyperlipidemia, unspecified: Secondary | ICD-10-CM | POA: Diagnosis not present

## 2017-06-29 DIAGNOSIS — E119 Type 2 diabetes mellitus without complications: Secondary | ICD-10-CM | POA: Diagnosis not present

## 2017-06-29 DIAGNOSIS — Z794 Long term (current) use of insulin: Secondary | ICD-10-CM | POA: Diagnosis not present

## 2017-07-14 DIAGNOSIS — J4520 Mild intermittent asthma, uncomplicated: Secondary | ICD-10-CM | POA: Diagnosis not present

## 2017-11-24 DIAGNOSIS — H5213 Myopia, bilateral: Secondary | ICD-10-CM | POA: Diagnosis not present

## 2017-11-29 DIAGNOSIS — E785 Hyperlipidemia, unspecified: Secondary | ICD-10-CM | POA: Diagnosis not present

## 2017-11-29 DIAGNOSIS — E119 Type 2 diabetes mellitus without complications: Secondary | ICD-10-CM | POA: Diagnosis not present

## 2017-11-29 DIAGNOSIS — E1169 Type 2 diabetes mellitus with other specified complication: Secondary | ICD-10-CM | POA: Diagnosis not present

## 2017-11-29 DIAGNOSIS — Z794 Long term (current) use of insulin: Secondary | ICD-10-CM | POA: Diagnosis not present

## 2018-01-25 DIAGNOSIS — E1169 Type 2 diabetes mellitus with other specified complication: Secondary | ICD-10-CM | POA: Diagnosis not present

## 2018-01-25 DIAGNOSIS — E119 Type 2 diabetes mellitus without complications: Secondary | ICD-10-CM | POA: Diagnosis not present

## 2018-01-25 DIAGNOSIS — E785 Hyperlipidemia, unspecified: Secondary | ICD-10-CM | POA: Diagnosis not present

## 2018-01-25 DIAGNOSIS — Z794 Long term (current) use of insulin: Secondary | ICD-10-CM | POA: Diagnosis not present

## 2018-01-31 DIAGNOSIS — E785 Hyperlipidemia, unspecified: Secondary | ICD-10-CM | POA: Diagnosis not present

## 2018-01-31 DIAGNOSIS — Z0000 Encounter for general adult medical examination without abnormal findings: Secondary | ICD-10-CM | POA: Diagnosis not present

## 2018-01-31 DIAGNOSIS — E1169 Type 2 diabetes mellitus with other specified complication: Secondary | ICD-10-CM | POA: Diagnosis not present

## 2018-01-31 DIAGNOSIS — E669 Obesity, unspecified: Secondary | ICD-10-CM | POA: Diagnosis not present

## 2018-02-23 DIAGNOSIS — J4520 Mild intermittent asthma, uncomplicated: Secondary | ICD-10-CM | POA: Diagnosis not present

## 2018-02-23 DIAGNOSIS — R05 Cough: Secondary | ICD-10-CM | POA: Diagnosis not present

## 2018-04-25 DIAGNOSIS — L02224 Furuncle of groin: Secondary | ICD-10-CM | POA: Diagnosis not present

## 2018-05-01 MED FILL — ACCU-CHEK GUIDE TEST STRIP: 25 days supply | Qty: 100 | Fill #0

## 2018-05-08 MED FILL — TRULICITY 1.5 MG/0.5 ML PEN: 1.5 | 28 days supply | Qty: 2 | Fill #0

## 2018-05-08 MED FILL — PRAVASTATIN SODIUM 20 MG TA: 20 | 90 days supply | Qty: 90 | Fill #0

## 2018-05-08 MED FILL — JARDIANCE 25 MG TABLET: 25 | 30 days supply | Qty: 30 | Fill #0

## 2018-05-08 MED FILL — LOSARTAN POTASSIUM 25 MG TA: 25 | 90 days supply | Qty: 90 | Fill #0

## 2018-06-07 DIAGNOSIS — E785 Hyperlipidemia, unspecified: Secondary | ICD-10-CM | POA: Diagnosis not present

## 2018-06-07 DIAGNOSIS — E1169 Type 2 diabetes mellitus with other specified complication: Secondary | ICD-10-CM | POA: Diagnosis not present

## 2018-07-28 DIAGNOSIS — E1169 Type 2 diabetes mellitus with other specified complication: Secondary | ICD-10-CM | POA: Diagnosis not present

## 2018-07-28 DIAGNOSIS — E785 Hyperlipidemia, unspecified: Secondary | ICD-10-CM | POA: Diagnosis not present

## 2018-08-04 ENCOUNTER — Other Ambulatory Visit: Payer: Self-pay | Admitting: Critical Care Medicine

## 2018-08-09 DIAGNOSIS — E669 Obesity, unspecified: Secondary | ICD-10-CM | POA: Diagnosis not present

## 2018-08-09 DIAGNOSIS — E785 Hyperlipidemia, unspecified: Secondary | ICD-10-CM | POA: Diagnosis not present

## 2018-08-09 DIAGNOSIS — E1169 Type 2 diabetes mellitus with other specified complication: Secondary | ICD-10-CM | POA: Diagnosis not present

## 2018-08-10 LAB — NOVEL CORONAVIRUS, NAA: SARS-CoV-2, NAA: NOT DETECTED

## 2018-08-21 IMAGING — MG MM DIGITAL DIAGNOSTIC UNILAT*L* W/ IMPLANTS
8 series · 8 of 16 positions shown · non-contrast
Comparison: Previous exam(s).

CLINICAL DATA: Short-term followup. Patient underwent biopsy of an
area of subtle architectural distortion in the upper outer left
breast yielding benign pathology. She has not undergone excision of
this. She also had 2 small masses on ultrasound, 1 which have been
stable since August 2014, and the other follow-up suspicious enough to
warrant biopsy. The ultrasound mass for which biopsy was
recommended, could not be confidently visualized sonographically at
the time of biopsy and therefore six-month follow-up was
recommended.

EXAM:
2D DIGITAL DIAGNOSTIC LEFT MAMMOGRAM WITH IMPLANTS AND ADJUNCT TOMO
ULTRASOUND LEFT BREAST
The patient has retropectoral implants. Standard and implant
displaced views were performed.

[L MLO (1 of 2)]
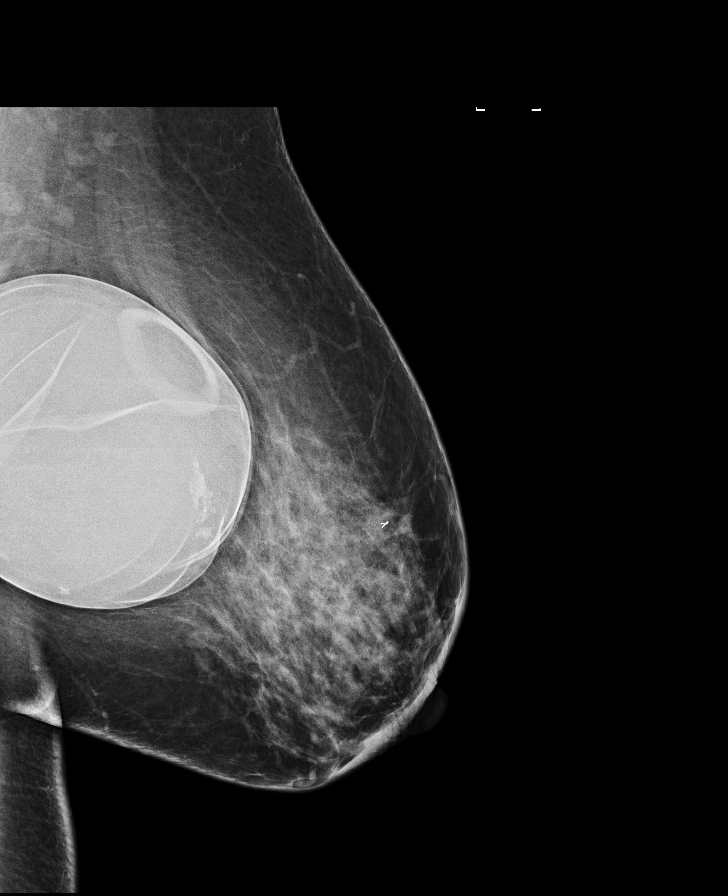

[L CC (1 of 2)]
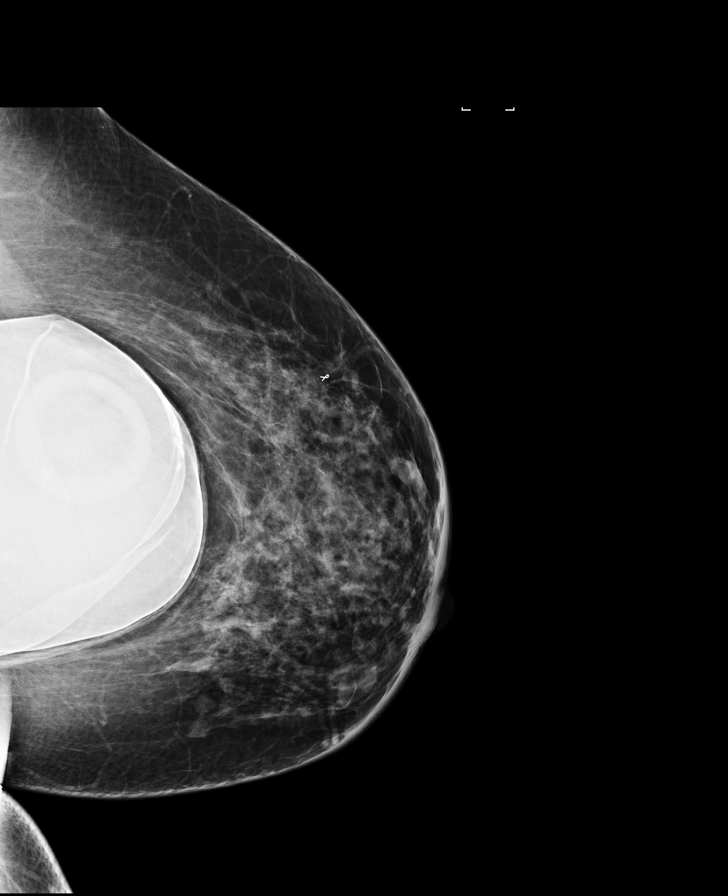

[L CC (2 of 2)]
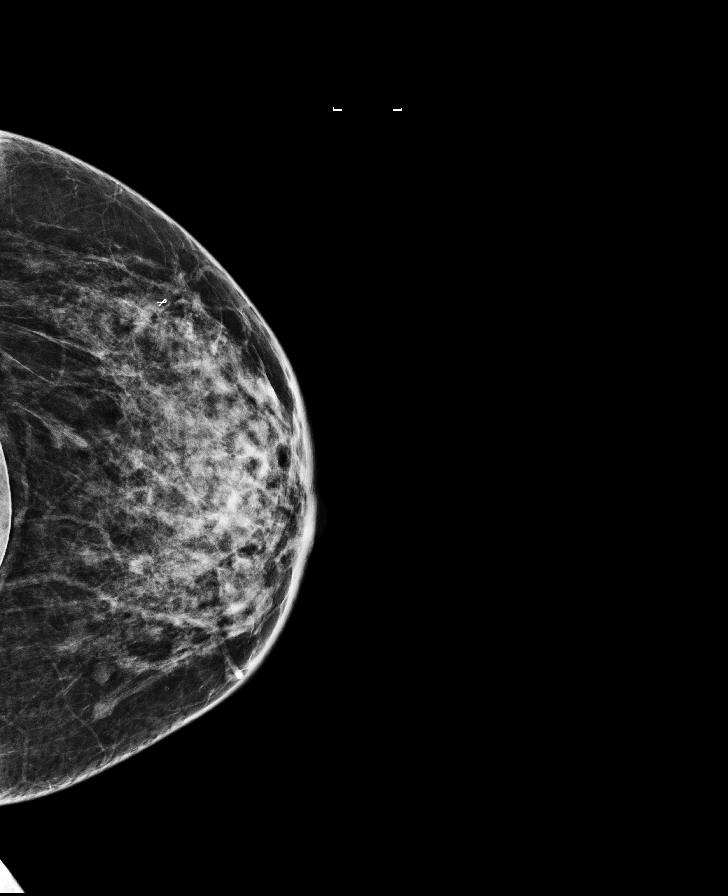

[L MLO synth-2D]
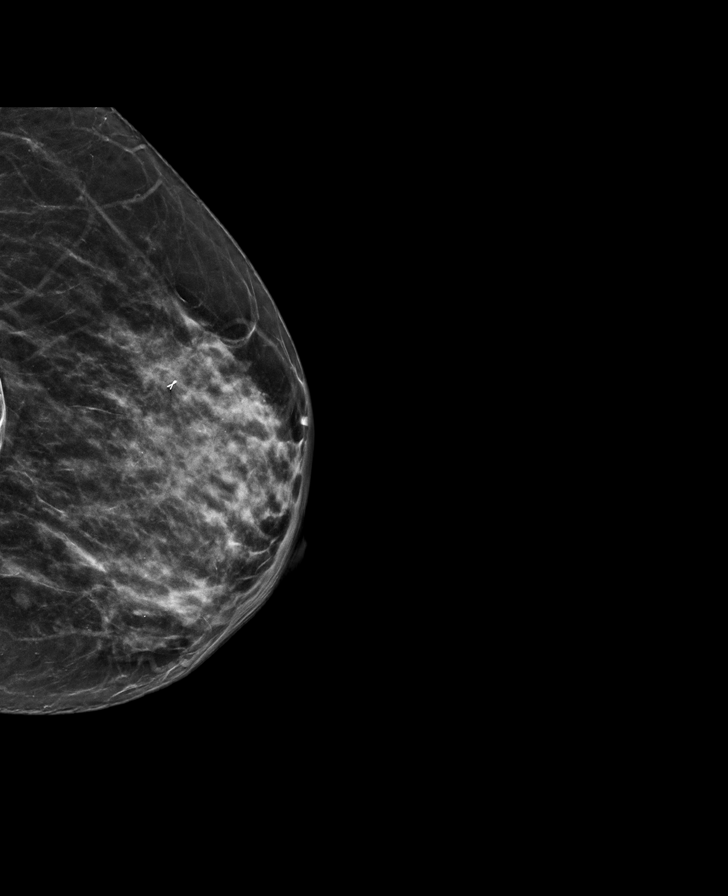

[L CC synth-2D]
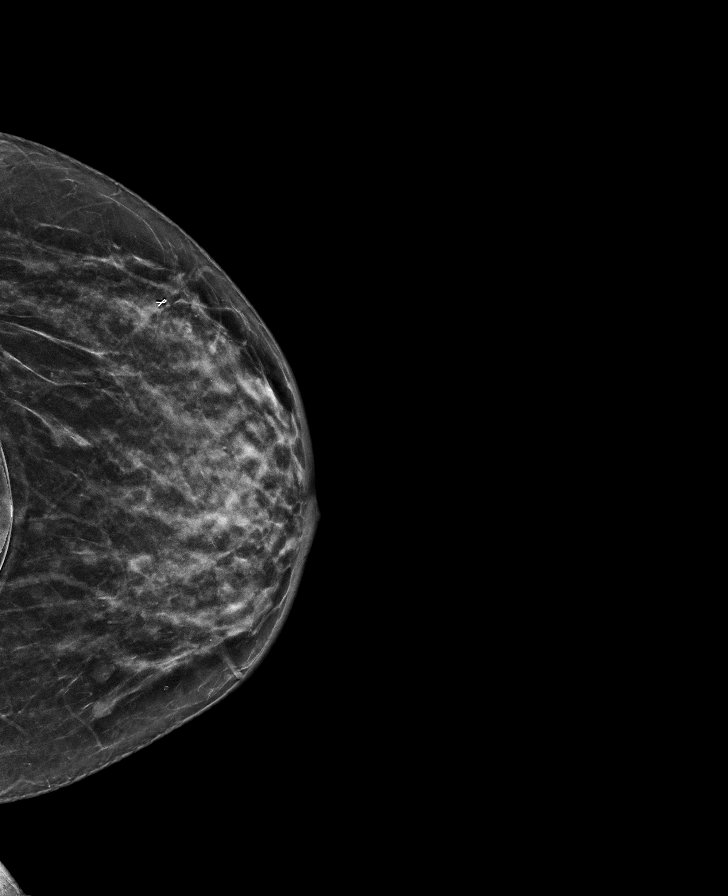

[L MLO (2 of 2)]
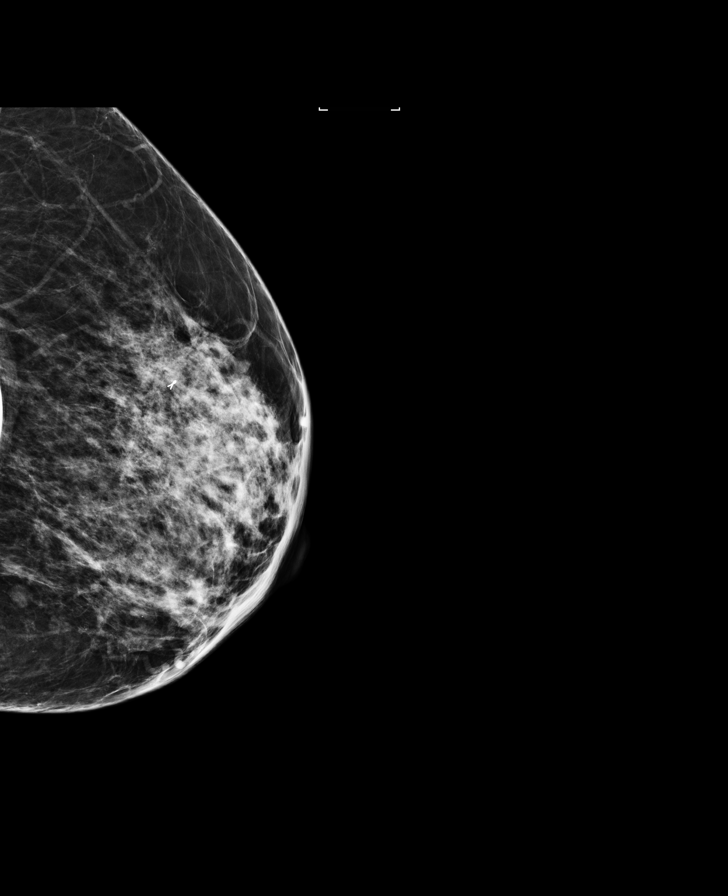

[L CCID BREAST TOMOSYNTHESIS IMAGE tomo · tomo slice 31/62.0]
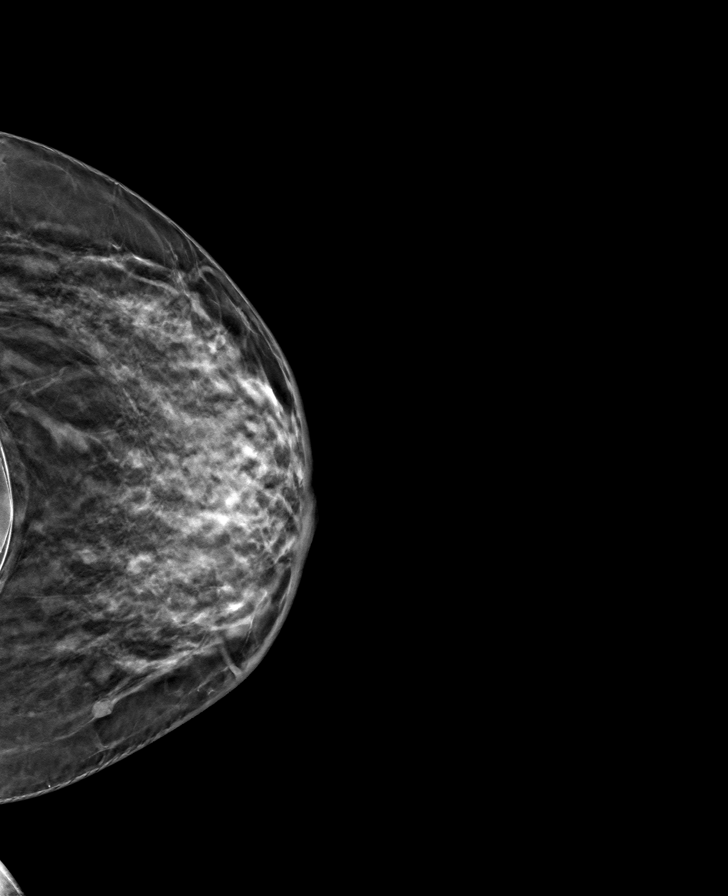

[L MLOID BREAST TOMOSYNTHESIS IMAGE tomo · tomo slice 33/65.0]
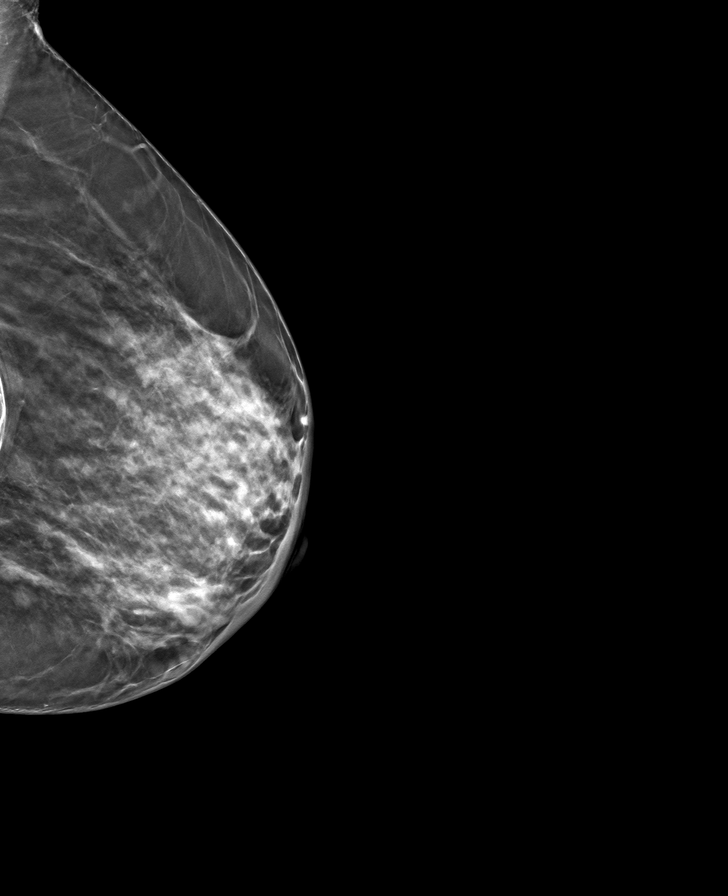

[8 of 16 positions shown; findings below may reference images not displayed]

ACR Breast Density Category c: The breast tissue is heterogeneously
dense, which may obscure small masses.
FINDINGS: Subtle distortion persists adjacent to the ribbon shaped biopsy clip
in the upper outer left breast. There are no discrete masses or
areas of architectural distortion. There are no suspicious
calcifications.

Targeted ultrasound is performed, showing a hypoechoic circumscribed
mass in the left breast, with mildly irregular margins, at the 2:30
o'clock position, 4 cm the nipple, measuring 6 x 4 x 6 mm, without
significant change from the prior study. In the 9 o'clock position,
1 cm from the nipple, there is an oval circumscribed hypoechoic mass
measuring 6 x 5 x 6 mm, stable from the prior exams.
IMPRESSION: 1. Subtle architectural distortion in the upper outer left breast,
previously biopsied yielding benign results. Given that this was an
architectural distortion, surgical consultation for excision is
recommended.
2. Probably benign small hypoechoic mass in the left breast at the
2:30 o'clock position, stable for 6 months. Additional short-term
follow-up recommended.
3. Benign hypoechoic mass the left breast at the 9 o'clock position,
1 cm from the nipple. No additional short-term follow-up
recommended.

RECOMMENDATION:
1. Surgical consultation for excision of the previously biopsied
architectural distortion in the upper outer left breast. This was
marked with a ribbon shaped biopsy clip.
2. Ultrasound of the left breast in 6 months to reassess the small
probably benign mass in the 2:30 o'clock position, 4 cm from the
nipple.

I have discussed the findings and recommendations with the patient.
Results were also provided in writing at the conclusion of the
visit. If applicable, a reminder letter will be sent to the patient
regarding the next appointment.

BI-RADS CATEGORY  4: Suspicious.

## 2018-10-25 DIAGNOSIS — R05 Cough: Secondary | ICD-10-CM | POA: Diagnosis not present

## 2018-10-25 DIAGNOSIS — J4520 Mild intermittent asthma, uncomplicated: Secondary | ICD-10-CM | POA: Diagnosis not present

## 2018-11-07 DIAGNOSIS — Z03818 Encounter for observation for suspected exposure to other biological agents ruled out: Secondary | ICD-10-CM | POA: Diagnosis not present

## 2018-11-09 HISTORY — PX: OTHER SURGICAL HISTORY: SHX169

## 2018-11-14 DIAGNOSIS — Z01818 Encounter for other preprocedural examination: Secondary | ICD-10-CM | POA: Diagnosis not present

## 2018-12-07 DIAGNOSIS — E119 Type 2 diabetes mellitus without complications: Secondary | ICD-10-CM | POA: Diagnosis not present

## 2018-12-07 DIAGNOSIS — E1169 Type 2 diabetes mellitus with other specified complication: Secondary | ICD-10-CM | POA: Diagnosis not present

## 2018-12-07 DIAGNOSIS — Z794 Long term (current) use of insulin: Secondary | ICD-10-CM | POA: Diagnosis not present

## 2018-12-07 DIAGNOSIS — E785 Hyperlipidemia, unspecified: Secondary | ICD-10-CM | POA: Diagnosis not present

## 2019-01-15 DIAGNOSIS — H5213 Myopia, bilateral: Secondary | ICD-10-CM | POA: Diagnosis not present

## 2019-02-05 DIAGNOSIS — E119 Type 2 diabetes mellitus without complications: Secondary | ICD-10-CM | POA: Diagnosis not present

## 2019-02-05 DIAGNOSIS — E1169 Type 2 diabetes mellitus with other specified complication: Secondary | ICD-10-CM | POA: Diagnosis not present

## 2019-02-05 DIAGNOSIS — Z794 Long term (current) use of insulin: Secondary | ICD-10-CM | POA: Diagnosis not present

## 2019-02-05 DIAGNOSIS — E785 Hyperlipidemia, unspecified: Secondary | ICD-10-CM | POA: Diagnosis not present

## 2019-02-12 DIAGNOSIS — E669 Obesity, unspecified: Secondary | ICD-10-CM | POA: Diagnosis not present

## 2019-02-12 DIAGNOSIS — E1169 Type 2 diabetes mellitus with other specified complication: Secondary | ICD-10-CM | POA: Diagnosis not present

## 2019-02-12 DIAGNOSIS — E785 Hyperlipidemia, unspecified: Secondary | ICD-10-CM | POA: Diagnosis not present

## 2019-02-12 DIAGNOSIS — Z0000 Encounter for general adult medical examination without abnormal findings: Secondary | ICD-10-CM | POA: Diagnosis not present

## 2019-03-06 DIAGNOSIS — J4521 Mild intermittent asthma with (acute) exacerbation: Secondary | ICD-10-CM | POA: Diagnosis not present

## 2019-05-11 ENCOUNTER — Other Ambulatory Visit: Payer: Self-pay | Admitting: Family Medicine

## 2019-05-11 DIAGNOSIS — Z1231 Encounter for screening mammogram for malignant neoplasm of breast: Secondary | ICD-10-CM

## 2019-06-07 ENCOUNTER — Other Ambulatory Visit: Payer: Self-pay | Admitting: "Endocrinology

## 2019-06-07 DIAGNOSIS — E1169 Type 2 diabetes mellitus with other specified complication: Secondary | ICD-10-CM | POA: Diagnosis not present

## 2019-06-07 DIAGNOSIS — E119 Type 2 diabetes mellitus without complications: Secondary | ICD-10-CM | POA: Diagnosis not present

## 2019-06-07 DIAGNOSIS — E785 Hyperlipidemia, unspecified: Secondary | ICD-10-CM | POA: Diagnosis not present

## 2019-06-21 ENCOUNTER — Ambulatory Visit
Admission: RE | Admit: 2019-06-21 | Discharge: 2019-06-21 | Disposition: A | Discharge status: Home / Self Care | Payer: 59 | Source: Ambulatory Visit | Attending: Family Medicine | Admitting: Family Medicine

## 2019-06-21 ENCOUNTER — Other Ambulatory Visit: Payer: Self-pay | Admitting: Family Medicine

## 2019-06-21 DIAGNOSIS — Z1231 Encounter for screening mammogram for malignant neoplasm of breast: Secondary | ICD-10-CM | POA: Diagnosis not present

## 2019-06-26 ENCOUNTER — Other Ambulatory Visit: Payer: Self-pay | Admitting: Family Medicine

## 2019-06-26 DIAGNOSIS — R928 Other abnormal and inconclusive findings on diagnostic imaging of breast: Secondary | ICD-10-CM

## 2019-06-28 ENCOUNTER — Ambulatory Visit
Admission: RE | Admit: 2019-06-28 | Discharge: 2019-06-28 | Disposition: A | Discharge status: Home / Self Care | Payer: 59 | Source: Ambulatory Visit | Attending: Family Medicine | Admitting: Family Medicine

## 2019-06-28 DIAGNOSIS — R922 Inconclusive mammogram: Secondary | ICD-10-CM | POA: Diagnosis not present

## 2019-06-28 DIAGNOSIS — R928 Other abnormal and inconclusive findings on diagnostic imaging of breast: Secondary | ICD-10-CM

## 2019-06-28 DIAGNOSIS — N6489 Other specified disorders of breast: Secondary | ICD-10-CM | POA: Diagnosis not present

## 2019-07-02 ENCOUNTER — Other Ambulatory Visit: Payer: Self-pay | Admitting: Family Medicine

## 2019-07-02 DIAGNOSIS — R928 Other abnormal and inconclusive findings on diagnostic imaging of breast: Secondary | ICD-10-CM

## 2019-07-16 ENCOUNTER — Ambulatory Visit
Admission: RE | Admit: 2019-07-16 | Discharge: 2019-07-16 | Disposition: A | Discharge status: Home / Self Care | Payer: 59 | Source: Ambulatory Visit | Attending: Family Medicine | Admitting: Family Medicine

## 2019-07-16 ENCOUNTER — Other Ambulatory Visit: Payer: Self-pay | Admitting: Family Medicine

## 2019-07-16 DIAGNOSIS — R928 Other abnormal and inconclusive findings on diagnostic imaging of breast: Secondary | ICD-10-CM

## 2019-07-16 DIAGNOSIS — N6031 Fibrosclerosis of right breast: Secondary | ICD-10-CM | POA: Diagnosis not present

## 2019-07-16 DIAGNOSIS — N6489 Other specified disorders of breast: Secondary | ICD-10-CM | POA: Diagnosis not present

## 2019-07-16 HISTORY — PX: BREAST BIOPSY: SHX20

## 2019-07-17 ENCOUNTER — Ambulatory Visit: Payer: 59

## 2019-07-17 LAB — SURGICAL PATHOLOGY

## 2019-07-18 ENCOUNTER — Telehealth: Payer: Self-pay

## 2019-07-18 NOTE — Telephone Encounter (Signed)
Virginia Rogers has an appt with Dr. Peyton Najjar on July 31, 2019 at 2:00.  Patient aware of time/date.

## 2019-07-31 ENCOUNTER — Ambulatory Visit: Payer: Self-pay | Admitting: General Surgery

## 2019-07-31 DIAGNOSIS — N6489 Other specified disorders of breast: Secondary | ICD-10-CM | POA: Diagnosis not present

## 2019-07-31 NOTE — H&P (View-Only) (Signed)
PATIENT PROFILE: Virginia Rogers is a 58 y.o. female who presents to the Clinic for consultation at the request of Virginia Rogers for evaluation of radial scar.  PCP:  Virginia Body, MD  HISTORY OF PRESENT ILLNESS: Virginia Rogers reports she had her usual screening mammogram.  Right breast distortion was found that needed additional images.  Diagnostic mammogram and ultrasound confirmed the persistent distortion of the right breast.  This led to core needle biopsy.  Core needle biopsy found radial scar features.  Patient denies palpable mass, skin changes, nipple discharge, nipple retraction.  Family history of breast cancer: None Family history of other cancers: Grandfather (lung) Menarche: 74 Menopause: 34  Used OCP: Yes Used estrogen and progesterone therapy: None  History of Radiation to the chest: None Number of pregnancies: 1 Age of first pregnancy: 21 Previous breast biopsies: 2 (fibroadenomas)  PROBLEM LIST: Problem List  Date Reviewed: 03/06/2019       Noted   Obesity, Class II, BMI 35-39.9, unspecified 09/03/2016   Vaccine counseling: Tetanus and PNA-23 vaccines administered in 06/2009 (09/03/16) 07/28/2015   Hyperlipidemia associated with type 2 diabetes mellitus (LDL 101 - 02/05/19) 07/28/2015   GERD without esophagitis 12/25/2013   Type 2 diabetes mellitus with hyperlipidemia (A1c 6.5% - 02/06/19) - followed by Dr. Honor Junes 08/03/2013   Migraines 08/03/2013   Asthma 07/03/2013      GENERAL REVIEW OF SYSTEMS:   General ROS: negative for - chills, fatigue, fever, weight gain or weight loss Allergy and Immunology ROS: negative for - hives  Hematological and Lymphatic ROS: negative for - bleeding problems or bruising, negative for palpable nodes Endocrine ROS: negative for - heat or cold intolerance, hair changes Respiratory ROS: negative for - cough, shortness of breath or wheezing Cardiovascular ROS: no chest pain or palpitations GI ROS: negative for nausea, vomiting,  abdominal pain, diarrhea, constipation Musculoskeletal ROS: negative for - joint swelling or muscle pain Neurological ROS: negative for - confusion, syncope Dermatological ROS: negative for pruritus and rash Psychiatric: negative for anxiety, depression, difficulty sleeping and memory loss  MEDICATIONS: Current Outpatient Medications  Medication Sig Dispense Refill   ACCU-CHEK FASTCLIX LANCET DRUM USE AS DIRECTED 200 each 5   ACCU-CHEK GUIDE GLUCOSE METER Misc 1 each by XX route as directed. Patient needs Accu-Chek Guide glucometer. Dx: E11.9 1 each 0   ACCU-CHEK GUIDE TEST STRIPS test strip by XX route 2 (two) times daily 200 strip 4   budesonide-formoteroL (SYMBICORT) 160-4.5 mcg/actuation inhaler Inhale 2 inhalations into the lungs 2 (two) times daily 1 inhalation 11   dulaglutide (TRULICITY) 3 WU/9.8 mL pen injector Inject 0.5 mLs (3 mg total) subcutaneously every 7 (seven) days 2 mL 12   empagliflozin (JARDIANCE) 25 mg tablet Take 1 tablet (25 mg total) by mouth once daily 90 tablet 4   glimepiride (AMARYL) 4 MG tablet Take 0.5 tablets (2 mg total) by mouth 2 (two) times daily 90 tablet 4   guaiFENesin (MUCINEX) 600 mg SR tablet Take 600 mg by mouth every 12 (twelve) hours as needed for Cough     ipratropium-albuteroL (DUO-NEB) nebulizer solution Take 3 mLs by nebulization 4 (four) times daily as needed for Wheezing 1080 mL 0   lancets 33 gauge Misc Use 1 Units as directed. Check CBG's fasting once daily and PRN if symptomatic. Dx: E11.9 100 each 3   loratadine (CLARITIN) 10 mg tablet Take 10 mg by mouth once daily.     losartan (COZAAR) 25 MG tablet TAKE 1 TABLET BY MOUTH  ONCE DAILY 90 tablet 1   montelukast (SINGULAIR) 10 mg tablet Take 1 tablet (10 mg total) by mouth nightly (Patient not taking: Reported on 06/07/2019  ) 90 tablet 3   nystatin (MYCOSTATIN) 100,000 unit/gram powder Apply topically 2 (two) times daily (Patient not taking: Reported on 06/07/2019  ) 60 g 1    pravastatin (PRAVACHOL) 40 MG tablet Take 1 tablet (40 mg total) by mouth nightly 90 tablet 1   predniSONE (DELTASONE) 10 MG tablet Prednisone 10 mg, 4 pills q day x 2 days, 3 pills q day x 2 days, 2 pills q day x 2 days, 1 pill q day x 2 days. (Patient not taking: Reported on 06/07/2019  ) 20 tablet 0   rimegepant (NURTEC ODT) 75 mg disintegrating tablet 1 tablet daily as needed for breakthrough migraine 8 tablet 0   No current facility-administered medications for this visit.    ALLERGIES: Metformin, Other, Penicillins, Saxagliptin, Sulfamethoxazole-trimethoprim, Vancomycin, Propranolol, and Red yeast rice  PAST MEDICAL HISTORY: Past Medical History:  Diagnosis Date   Allergic state    Penicillin, Vancomycin ; sensitive to metformin   Asthma without status asthmaticus, unspecified    Benign essential tremor    Borderline diabetes mellitus    (A1c 6.3% - 12/2010)   Colon polyps    Diabetes mellitus type 2, uncomplicated (CMS-HCC) 1/49/70   Encounter for blood transfusion 1995   Fibroma of foot, left    GERD without esophagitis    Heart murmur, unspecified 1977   after rheumatic fever   History of blood transfusion 1995   History of pneumonia 2004 and 2006   History of sleep apnea    Migraine    Obesity    Pure hypercholesterolemia (LDL 116 - 11/12/15) - declined statin    Rheumatic fever     PAST SURGICAL HISTORY: Past Surgical History:  Procedure Laterality Date    Ovarian cyst surgery,  2006   Bilateral rotator cuff surgery  2004, 2009   breast implants     COLONOSCOPY  2004   polyps   excision of breast mass Left 04/15/2016   Dr Rochel Brome   HYSTERECTOMY     Left breast biopsy     09/15/15   TONSILLECTOMY  2009     FAMILY HISTORY: Family History  Problem Relation Age of Onset   Diabetes Mother    Myocardial Infarction (Heart attack) Father    Diabetes type II Sister    Asthma Sister        sarcoid   Coronary Artery Disease  (Blocked arteries around heart) Sister    Sarcoidosis Sister    Headaches Sister    Arthritis Sister    Myocardial Infarction (Heart attack) Brother    Irregular Heart Beat (Arrhythmia) Brother        v tach   Asthma Brother        died in 2013-v-fib arrest   Allergies Brother    Asthma Maternal Grandfather    Coronary Artery Disease (Blocked arteries around heart) Maternal Grandmother      SOCIAL HISTORY: Social History   Socioeconomic History   Marital status: Married    Spouse name: Not on file   Number of children: Not on file   Years of education: Not on file   Highest education level: Not on file  Occupational History   Not on file  Tobacco Use   Smoking status: Never Smoker   Smokeless tobacco: Never Used  Vaping Use   Vaping  Use: Never used  Substance and Sexual Activity   Alcohol use: Never    Alcohol/week: 0.0 standard drinks   Drug use: No   Sexual activity: Yes    Partners: Male    Birth control/protection: Post-menopausal    Comment: hysterectomy 2006  Other Topics Concern   Not on file  Social History Narrative   Marital Status- Married   Lives with husband   Employment- [employer] ([employer])   Exercise hx- None currently   Religious Affiliation- Angelican   Social Determinants of Health   Financial Resource Strain:    Difficulty of Paying Living Expenses:   Food Insecurity:    Worried About Charity fundraiser in the Last Year:    Arboriculturist in the Last Year:   Transportation Needs:    Film/video editor (Medical):    Lack of Transportation (Non-Medical):     PHYSICAL EXAM: Vitals:   07/31/19 1344  BP: 138/82  Pulse: 107  Temp: 37.7 C (99.9 F)   Rogers mass index is 37.69 kg/m. Weight: (!) 101.2 kg (223 lb)   GENERAL: Alert, active, oriented x3  HEENT: Pupils equal reactive to light. Extraocular movements are intact. Sclera clear. Palpebral conjunctiva normal red color.Pharynx clear.  NECK: Supple with no  palpable mass and no adenopathy.  LUNGS: Sound clear with no rales rhonchi or wheezes.  HEART: Regular rhythm S1 and S2 without murmur.  BREAST: breasts appear normal, no suspicious masses, no skin or nipple changes or axillary nodes.  ABDOMEN: Soft and depressible, nontender with no palpable mass, no hepatomegaly.  EXTREMITIES: Well-developed well-nourished symmetrical with no dependent edema.  NEUROLOGICAL: Awake alert oriented, facial expression symmetrical, moving all extremities.  REVIEW OF DATA: I have reviewed the following data today: Office Visit on 06/07/2019  Component Date Value   Hemoglobin A1C 06/07/2019 6.6*   Average Blood Glucose (C* 06/07/2019 143      ASSESSMENT: Ms. Dettmann is a 59 y.o. female presenting for consultation for right breast radial scar.    Patient was oriented again about the pathology results. Surgical alternatives were discussed with patient including radiofrequency tag guided excisional biopsy.  The goal of the surgery that is ruling out malignancy was discussed with the patient.  Surgical technique and post operative care was discussed with patient. Risk of surgery was discussed with patient including but not limited to: wound infection, seroma, hematoma, brachial plexopathy, mondor's disease (thrombosis of small veins of breast), chronic wound pain, breast lymphedema, altered sensation to the nipple and cosmesis among others.   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 Right breast Radiofrequency tag guided excisional biopsy (19125) 2. Avoid aspirin 5 days before surgery 3. Contact us if has any question or concern.  Patient verbalized understanding, all questions were answered, and were agreeable with the plan outlined above.     Herbert Pun, MD  Electronically signed by Herbert Pun, MD

## 2019-07-31 NOTE — H&P (Signed)
PATIENT PROFILE: Virginia Rogers is a 59 y.o. female who presents to the Clinic for consultation at the request of Dr. Netty Starring for evaluation of radial scar.  PCP:  Dion Body, MD  HISTORY OF PRESENT ILLNESS: Virginia Rogers reports she had her usual screening mammogram.  Right breast distortion was found that needed additional images.  Diagnostic mammogram and ultrasound confirmed the persistent distortion of the right breast.  This led to core needle biopsy.  Core needle biopsy found radial scar features.  Patient denies palpable mass, skin changes, nipple discharge, nipple retraction.  Family history of breast cancer: None Family history of other cancers: Grandfather (lung) Menarche: 50 Menopause: 34  Used OCP: Yes Used estrogen and progesterone therapy: None  History of Radiation to the chest: None Number of pregnancies: 1 Age of first pregnancy: 21 Previous breast biopsies: 2 (fibroadenomas)  PROBLEM LIST: Problem List  Date Reviewed: 03/06/2019       Noted   Obesity, Class II, BMI 35-39.9, unspecified 09/03/2016   Vaccine counseling: Tetanus and PNA-23 vaccines administered in 06/2009 (09/03/16) 07/28/2015   Hyperlipidemia associated with type 2 diabetes mellitus (LDL 101 - 02/05/19) 07/28/2015   GERD without esophagitis 12/25/2013   Type 2 diabetes mellitus with hyperlipidemia (A1c 6.5% - 02/06/19) - followed by Dr. Honor Junes 08/03/2013   Migraines 08/03/2013   Asthma 07/03/2013      GENERAL REVIEW OF SYSTEMS:   General ROS: negative for - chills, fatigue, fever, weight gain or weight loss Allergy and Immunology ROS: negative for - hives  Hematological and Lymphatic ROS: negative for - bleeding problems or bruising, negative for palpable nodes Endocrine ROS: negative for - heat or cold intolerance, hair changes Respiratory ROS: negative for - cough, shortness of breath or wheezing Cardiovascular ROS: no chest pain or palpitations GI ROS: negative for nausea, vomiting,  abdominal pain, diarrhea, constipation Musculoskeletal ROS: negative for - joint swelling or muscle pain Neurological ROS: negative for - confusion, syncope Dermatological ROS: negative for pruritus and rash Psychiatric: negative for anxiety, depression, difficulty sleeping and memory loss  MEDICATIONS: Current Outpatient Medications  Medication Sig Dispense Refill   ACCU-CHEK FASTCLIX LANCET DRUM USE AS DIRECTED 200 each 5   ACCU-CHEK GUIDE GLUCOSE METER Misc 1 each by XX route as directed. Patient needs Accu-Chek Guide glucometer. Dx: E11.9 1 each 0   ACCU-CHEK GUIDE TEST STRIPS test strip by XX route 2 (two) times daily 200 strip 4   budesonide-formoteroL (SYMBICORT) 160-4.5 mcg/actuation inhaler Inhale 2 inhalations into the lungs 2 (two) times daily 1 inhalation 11   dulaglutide (TRULICITY) 3 ZE/0.9 mL pen injector Inject 0.5 mLs (3 mg total) subcutaneously every 7 (seven) days 2 mL 12   empagliflozin (JARDIANCE) 25 mg tablet Take 1 tablet (25 mg total) by mouth once daily 90 tablet 4   glimepiride (AMARYL) 4 MG tablet Take 0.5 tablets (2 mg total) by mouth 2 (two) times daily 90 tablet 4   guaiFENesin (MUCINEX) 600 mg SR tablet Take 600 mg by mouth every 12 (twelve) hours as needed for Cough     ipratropium-albuteroL (DUO-NEB) nebulizer solution Take 3 mLs by nebulization 4 (four) times daily as needed for Wheezing 1080 mL 0   lancets 33 gauge Misc Use 1 Units as directed. Check CBG's fasting once daily and PRN if symptomatic. Dx: E11.9 100 each 3   loratadine (CLARITIN) 10 mg tablet Take 10 mg by mouth once daily.     losartan (COZAAR) 25 MG tablet TAKE 1 TABLET BY MOUTH  ONCE DAILY 90 tablet 1   montelukast (SINGULAIR) 10 mg tablet Take 1 tablet (10 mg total) by mouth nightly (Patient not taking: Reported on 06/07/2019  ) 90 tablet 3   nystatin (MYCOSTATIN) 100,000 unit/gram powder Apply topically 2 (two) times daily (Patient not taking: Reported on 06/07/2019  ) 60 g 1    pravastatin (PRAVACHOL) 40 MG tablet Take 1 tablet (40 mg total) by mouth nightly 90 tablet 1   predniSONE (DELTASONE) 10 MG tablet Prednisone 10 mg, 4 pills q day x 2 days, 3 pills q day x 2 days, 2 pills q day x 2 days, 1 pill q day x 2 days. (Patient not taking: Reported on 06/07/2019  ) 20 tablet 0   rimegepant (NURTEC ODT) 75 mg disintegrating tablet 1 tablet daily as needed for breakthrough migraine 8 tablet 0   No current facility-administered medications for this visit.    ALLERGIES: Metformin, Other, Penicillins, Saxagliptin, Sulfamethoxazole-trimethoprim, Vancomycin, Propranolol, and Red yeast rice  PAST MEDICAL HISTORY: Past Medical History:  Diagnosis Date   Allergic state    Penicillin, Vancomycin ; sensitive to metformin   Asthma without status asthmaticus, unspecified    Benign essential tremor    Borderline diabetes mellitus    (A1c 6.3% - 12/2010)   Colon polyps    Diabetes mellitus type 2, uncomplicated (CMS-HCC) 08/13/21   Encounter for blood transfusion 1995   Fibroma of foot, left    GERD without esophagitis    Heart murmur, unspecified 1977   after rheumatic fever   History of blood transfusion 1995   History of pneumonia 2004 and 2006   History of sleep apnea    Migraine    Obesity    Pure hypercholesterolemia (LDL 116 - 11/12/15) - declined statin    Rheumatic fever     PAST SURGICAL HISTORY: Past Surgical History:  Procedure Laterality Date    Ovarian cyst surgery,  2006   Bilateral rotator cuff surgery  2004, 2009   breast implants     COLONOSCOPY  2004   polyps   excision of breast mass Left 04/15/2016   Dr Rochel Brome   HYSTERECTOMY     Left breast biopsy     09/15/15   TONSILLECTOMY  2009     FAMILY HISTORY: Family History  Problem Relation Age of Onset   Diabetes Mother    Myocardial Infarction (Heart attack) Father    Diabetes type II Sister    Asthma Sister        sarcoid   Coronary Artery Disease  (Blocked arteries around heart) Sister    Sarcoidosis Sister    Headaches Sister    Arthritis Sister    Myocardial Infarction (Heart attack) Brother    Irregular Heart Beat (Arrhythmia) Brother        v tach   Asthma Brother        died in 2013-v-fib arrest   Allergies Brother    Asthma Maternal Grandfather    Coronary Artery Disease (Blocked arteries around heart) Maternal Grandmother      SOCIAL HISTORY: Social History   Socioeconomic History   Marital status: Married    Spouse name: Not on file   Number of children: Not on file   Years of education: Not on file   Highest education level: Not on file  Occupational History   Not on file  Tobacco Use   Smoking status: Never Smoker   Smokeless tobacco: Never Used  Vaping Use   Vaping  Use: Never used  Substance and Sexual Activity   Alcohol use: Never    Alcohol/week: 0.0 standard drinks   Drug use: No   Sexual activity: Yes    Partners: Male    Birth control/protection: Post-menopausal    Comment: hysterectomy 2006  Other Topics Concern   Not on file  Social History Narrative   Marital Status- Married   Lives with husband   Employment- [employer] ([employer])   Exercise hx- None currently   Religious Affiliation- Angelican   Social Determinants of Health   Financial Resource Strain:    Difficulty of Paying Living Expenses:   Food Insecurity:    Worried About Charity fundraiser in the Last Year:    Arboriculturist in the Last Year:   Transportation Needs:    Film/video editor (Medical):    Lack of Transportation (Non-Medical):     PHYSICAL EXAM: Vitals:   07/31/19 1344  BP: 138/82  Pulse: 107  Temp: 37.7 C (99.9 F)   Body mass index is 37.69 kg/m. Weight: (!) 101.2 kg (223 lb)   GENERAL: Alert, active, oriented x3  HEENT: Pupils equal reactive to light. Extraocular movements are intact. Sclera clear. Palpebral conjunctiva normal red color.Pharynx clear.  NECK: Supple with no  palpable mass and no adenopathy.  LUNGS: Sound clear with no rales rhonchi or wheezes.  HEART: Regular rhythm S1 and S2 without murmur.  BREAST: breasts appear normal, no suspicious masses, no skin or nipple changes or axillary nodes.  ABDOMEN: Soft and depressible, nontender with no palpable mass, no hepatomegaly.  EXTREMITIES: Well-developed well-nourished symmetrical with no dependent edema.  NEUROLOGICAL: Awake alert oriented, facial expression symmetrical, moving all extremities.  REVIEW OF DATA: I have reviewed the following data today: Office Visit on 06/07/2019  Component Date Value   Hemoglobin A1C 06/07/2019 6.6*   Average Blood Glucose (C* 06/07/2019 143      ASSESSMENT: Ms. Studzinski is a 59 y.o. female presenting for consultation for right breast radial scar.    Patient was oriented again about the pathology results. Surgical alternatives were discussed with patient including radiofrequency tag guided excisional biopsy.  The goal of the surgery that is ruling out malignancy was discussed with the patient.  Surgical technique and post operative care was discussed with patient. Risk of surgery was discussed with patient including but not limited to: wound infection, seroma, hematoma, brachial plexopathy, mondor's disease (thrombosis of small veins of breast), chronic wound pain, breast lymphedema, altered sensation to the nipple and cosmesis among others.   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 Right breast Radiofrequency tag guided excisional biopsy (19125) 2. Avoid aspirin 5 days before surgery 3. Contact us if has any question or concern.  Patient verbalized understanding, all questions were answered, and were agreeable with the plan outlined above.     Herbert Pun, MD  Electronically signed by Herbert Pun, MD

## 2019-08-01 ENCOUNTER — Other Ambulatory Visit: Payer: Self-pay | Admitting: General Surgery

## 2019-08-01 DIAGNOSIS — N6489 Other specified disorders of breast: Secondary | ICD-10-CM

## 2019-08-06 ENCOUNTER — Ambulatory Visit
Admission: RE | Admit: 2019-08-06 | Discharge: 2019-08-06 | Disposition: A | Discharge status: Home / Self Care | Payer: 59 | Source: Ambulatory Visit | Attending: General Surgery | Admitting: General Surgery

## 2019-08-06 DIAGNOSIS — N6489 Other specified disorders of breast: Secondary | ICD-10-CM | POA: Insufficient documentation

## 2019-08-06 DIAGNOSIS — R928 Other abnormal and inconclusive findings on diagnostic imaging of breast: Secondary | ICD-10-CM | POA: Diagnosis not present

## 2019-08-08 ENCOUNTER — Encounter
Admission: RE | Admit: 2019-08-08 | Discharge: 2019-08-08 | Disposition: A | Discharge status: Home / Self Care | Payer: 59 | Source: Ambulatory Visit | Attending: General Surgery | Admitting: General Surgery

## 2019-08-08 ENCOUNTER — Other Ambulatory Visit: Payer: Self-pay

## 2019-08-08 DIAGNOSIS — Z01818 Encounter for other preprocedural examination: Secondary | ICD-10-CM | POA: Insufficient documentation

## 2019-08-08 HISTORY — DX: Cardiac murmur, unspecified: R01.1

## 2019-08-08 HISTORY — DX: Other complications of anesthesia, initial encounter: T88.59XA

## 2019-08-08 NOTE — Patient Instructions (Signed)
Your procedure is scheduled on: 08-17-19 FRIDAY Report to Same Day Surgery 2nd floor medical mall [facility] Entrance-take elevator on left to 2nd floor.  Check in with surgery information desk.) To find out your arrival time please call [phone] between 1PM - 3PM on 08-16-19 THURSDAY  Remember: Instructions that are not followed completely may result in serious medical risk, up to and including death, or upon the discretion of your surgeon and anesthesiologist your surgery may need to be rescheduled.    _x___ 1. Do not eat food after midnight the night before your procedure. NO GUM OR CANDY AFTER MIDNIGHT. You may drink WATER up to 2 hours before you are scheduled to arrive at the hospital for your procedure.  Do not drink WATER within 2 hours of your scheduled arrival to the hospital.  Type 1 and type 2 diabetics should only drink water     __x__ 2. No Alcohol for 24 hours before or after surgery.   __x__3. No Smoking or e-cigarettes for 24 prior to surgery.  Do not use any chewable tobacco products for at least 6 hour prior to surgery   ____  4. Bring all medications with you on the day of surgery if instructed.    __x__ 5. Notify your doctor if there is any change in your medical condition     (cold, fever, infections).    x___6. On the morning of surgery brush your teeth with toothpaste and water.  You may rinse your mouth with mouth wash if you wish.  Do not swallow any toothpaste or mouthwash.   Do not wear jewelry, make-up, hairpins, clips or nail polish.  Do not wear lotions, powders, or perfumes.   Do not shave 48 hours prior to surgery. Men may shave face and neck.  Do not bring valuables to the hospital.    [facility] is not responsible for any belongings or valuables.               Contacts, dentures or bridgework may not be worn into surgery.  Leave your suitcase in the car. After surgery it may be brought to your room.  For patients admitted to the hospital,  discharge time is determined by your treatment team.  _  Patients discharged the day of surgery will not be allowed to drive home.  You will need someone to drive you home and stay with you the night of your procedure.    Please read over the following fact sheets that you were given:   [facility] Preparing for Surgery  ____ TAKE THE FOLLOWING MEDICATION THE MORNING OF SURGERY WITH A SMALL SIP OF WATER. These include:  1. NONE  2.  3.  4.  5.  6.  ____Fleets enema or Magnesium Citrate as directed.   _x___ Use CHG Soap or sage wipes as directed on instruction sheet   _X___ Use inhalers on the day of surgery and bring to hospital day of surgery-USE YOUR SYMBICORT INHALER AND YOUR NEBULIZER THE DAY OF SURGERY   ____ Stop Metformin and Janumet 2 days prior to surgery.    ____ Take 1/2 of usual insulin dose the night before surgery and none on the morning surgery.   ____ Follow recommendations from Cardiologist, Pulmonologist or PCP regarding stopping Aspirin, Coumadin, Plavix ,Eliquis, Effient, or Pradaxa, and Pletal.  X____Stop Anti-inflammatories such as Advil, Aleve, Ibuprofen, Motrin, Naproxen, Naprosyn, Goodies powders or aspirin products NOW-OK to take Tylenol    ____ Stop supplements until  after surgery   ____ Bring C-Pap to the hospital.

## 2019-08-14 ENCOUNTER — Other Ambulatory Visit: Payer: Self-pay

## 2019-08-14 ENCOUNTER — Encounter
Admission: RE | Admit: 2019-08-14 | Discharge: 2019-08-14 | Disposition: A | Discharge status: Home / Self Care | Payer: 59 | Source: Ambulatory Visit | Attending: General Surgery | Admitting: General Surgery

## 2019-08-14 DIAGNOSIS — E119 Type 2 diabetes mellitus without complications: Secondary | ICD-10-CM | POA: Diagnosis not present

## 2019-08-14 DIAGNOSIS — Z20822 Contact with and (suspected) exposure to covid-19: Secondary | ICD-10-CM | POA: Diagnosis not present

## 2019-08-14 DIAGNOSIS — Z01818 Encounter for other preprocedural examination: Secondary | ICD-10-CM | POA: Diagnosis not present

## 2019-08-14 LAB — BASIC METABOLIC PANEL
Anion gap: 11 (ref 5–15)
BUN: 13 mg/dL (ref 6–20)
CO2: 23 mmol/L (ref 22–32)
Calcium: 9.1 mg/dL (ref 8.9–10.3)
Chloride: 105 mmol/L (ref 98–111)
Creatinine, Ser: 0.72 mg/dL (ref 0.44–1.00)
GFR calc Af Amer: >60 mL/min (ref >60)
GFR calc non Af Amer: >60 mL/min (ref >60)
Glucose, Bld: 154 mg/dL — ABNORMAL HIGH (ref 70–99)
Potassium: 3.9 mmol/L (ref 3.5–5.1)
Sodium: 139 mmol/L (ref 135–145)

## 2019-08-14 LAB — CBC
HCT: 44.6 % (ref 36.0–46.0)
Hemoglobin: 15.5 g/dL — ABNORMAL HIGH (ref 12.0–15.0)
MCH: 31.1 pg (ref 26.0–34.0)
MCHC: 34.8 g/dL (ref 30.0–36.0)
MCV: 89.4 fL (ref 80.0–100.0)
Platelets: 421 10*3/uL — ABNORMAL HIGH (ref 150–400)
RBC: 4.99 MIL/uL (ref 3.87–5.11)
RDW: 12.5 % (ref 11.5–15.5)
WBC: 6.8 10*3/uL (ref 4.0–10.5)
nRBC: 0 % (ref 0.0–0.2)

## 2019-08-15 ENCOUNTER — Other Ambulatory Visit
Admission: RE | Admit: 2019-08-15 | Discharge: 2019-08-15 | Disposition: A | Discharge status: Home / Self Care | Payer: 59 | Source: Ambulatory Visit | Attending: General Surgery | Admitting: General Surgery

## 2019-08-15 DIAGNOSIS — Z20822 Contact with and (suspected) exposure to covid-19: Secondary | ICD-10-CM | POA: Diagnosis not present

## 2019-08-15 DIAGNOSIS — Z01818 Encounter for other preprocedural examination: Secondary | ICD-10-CM | POA: Diagnosis not present

## 2019-08-15 LAB — SARS CORONAVIRUS 2 (TAT 6-24 HRS): SARS Coronavirus 2: NEGATIVE

## 2019-08-16 MED ORDER — CLINDAMYCIN PHOSPHATE 900 MG/50ML IV SOLN: 900.0000 mg | INTRAVENOUS | Status: DC

## 2019-08-17 ENCOUNTER — Encounter: Payer: Self-pay | Admitting: General Surgery

## 2019-08-17 ENCOUNTER — Ambulatory Visit
Admission: RE | Admit: 2019-08-17 | Discharge: 2019-08-17 | Disposition: A | Discharge status: Home / Self Care | Payer: 59 | Source: Ambulatory Visit | Attending: General Surgery | Admitting: General Surgery

## 2019-08-17 ENCOUNTER — Ambulatory Visit: Payer: 59 | Admitting: Certified Registered"

## 2019-08-17 ENCOUNTER — Ambulatory Visit
Admission: RE | Admit: 2019-08-17 | Discharge: 2019-08-17 | Disposition: A | Discharge status: Home / Self Care | Payer: 59 | Attending: General Surgery | Admitting: General Surgery

## 2019-08-17 ENCOUNTER — Other Ambulatory Visit: Payer: Self-pay

## 2019-08-17 ENCOUNTER — Encounter
Admission: RE | Disposition: A | Discharge status: Home / Self Care | Payer: Self-pay | Source: Home / Self Care | Attending: General Surgery

## 2019-08-17 DIAGNOSIS — E7800 Pure hypercholesterolemia, unspecified: Secondary | ICD-10-CM | POA: Insufficient documentation

## 2019-08-17 DIAGNOSIS — J45909 Unspecified asthma, uncomplicated: Secondary | ICD-10-CM | POA: Diagnosis not present

## 2019-08-17 DIAGNOSIS — N6489 Other specified disorders of breast: Secondary | ICD-10-CM

## 2019-08-17 DIAGNOSIS — Z7689 Persons encountering health services in other specified circumstances: Secondary | ICD-10-CM | POA: Diagnosis not present

## 2019-08-17 DIAGNOSIS — G43909 Migraine, unspecified, not intractable, without status migrainosus: Secondary | ICD-10-CM | POA: Diagnosis not present

## 2019-08-17 DIAGNOSIS — N62 Hypertrophy of breast: Secondary | ICD-10-CM | POA: Insufficient documentation

## 2019-08-17 DIAGNOSIS — Z7951 Long term (current) use of inhaled steroids: Secondary | ICD-10-CM | POA: Insufficient documentation

## 2019-08-17 DIAGNOSIS — Z881 Allergy status to other antibiotic agents status: Secondary | ICD-10-CM | POA: Insufficient documentation

## 2019-08-17 DIAGNOSIS — E119 Type 2 diabetes mellitus without complications: Secondary | ICD-10-CM | POA: Diagnosis not present

## 2019-08-17 DIAGNOSIS — Z6837 Body mass index (BMI) 37.0-37.9, adult: Secondary | ICD-10-CM | POA: Diagnosis not present

## 2019-08-17 DIAGNOSIS — Z880 Allergy status to penicillin: Secondary | ICD-10-CM | POA: Diagnosis not present

## 2019-08-17 DIAGNOSIS — N6081 Other benign mammary dysplasias of right breast: Secondary | ICD-10-CM | POA: Diagnosis not present

## 2019-08-17 DIAGNOSIS — Z882 Allergy status to sulfonamides status: Secondary | ICD-10-CM | POA: Insufficient documentation

## 2019-08-17 DIAGNOSIS — E669 Obesity, unspecified: Secondary | ICD-10-CM | POA: Diagnosis not present

## 2019-08-17 DIAGNOSIS — E785 Hyperlipidemia, unspecified: Secondary | ICD-10-CM | POA: Diagnosis not present

## 2019-08-17 DIAGNOSIS — Z7984 Long term (current) use of oral hypoglycemic drugs: Secondary | ICD-10-CM | POA: Insufficient documentation

## 2019-08-17 DIAGNOSIS — Z79899 Other long term (current) drug therapy: Secondary | ICD-10-CM | POA: Insufficient documentation

## 2019-08-17 DIAGNOSIS — Z888 Allergy status to other drugs, medicaments and biological substances status: Secondary | ICD-10-CM | POA: Insufficient documentation

## 2019-08-17 DIAGNOSIS — R928 Other abnormal and inconclusive findings on diagnostic imaging of breast: Secondary | ICD-10-CM | POA: Diagnosis not present

## 2019-08-17 HISTORY — PX: BREAST BIOPSY WITH RADIO FREQUENCY LOCALIZER: SHX6895

## 2019-08-17 LAB — GLUCOSE, CAPILLARY
Glucose-Capillary: 93 mg/dL (ref 70–99)
Glucose-Capillary: 99 mg/dL (ref 70–99)

## 2019-08-17 SURGERY — BREAST BIOPSY WITH RADIO FREQUENCY LOCALIZER
Anesthesia: General | Laterality: Right

## 2019-08-17 MED ORDER — HYDROCODONE-ACETAMINOPHEN 5-325 MG PO TABS: 1.0000 | ORAL_TABLET | ORAL | 0 refills | Status: AC | PRN

## 2019-08-17 MED ORDER — BUPIVACAINE-EPINEPHRINE (PF) 0.5% -1:200000 IJ SOLN
INTRAMUSCULAR | Status: AC
  Filled 2019-08-17: qty 30

## 2019-08-17 MED ORDER — FENTANYL CITRATE (PF) 100 MCG/2ML IJ SOLN
INTRAMUSCULAR | Status: AC
  Filled 2019-08-17: qty 2

## 2019-08-17 MED ORDER — LIDOCAINE HCL (PF) 2 % IJ SOLN
INTRAMUSCULAR | Status: AC
  Filled 2019-08-17: qty 5

## 2019-08-17 MED ORDER — LIDOCAINE HCL (CARDIAC) PF 100 MG/5ML IV SOSY
PREFILLED_SYRINGE | INTRAVENOUS | Status: DC | PRN
  Administered 2019-08-17: 100 mg via INTRAVENOUS

## 2019-08-17 MED ORDER — SEVOFLURANE IN SOLN
RESPIRATORY_TRACT | Status: AC
  Filled 2019-08-17: qty 250

## 2019-08-17 MED ORDER — DEXAMETHASONE SODIUM PHOSPHATE 10 MG/ML IJ SOLN
INTRAMUSCULAR | Status: DC | PRN
  Administered 2019-08-17: 10 mg via INTRAVENOUS

## 2019-08-17 MED ORDER — CHLORHEXIDINE GLUCONATE 0.12 % MT SOLN
OROMUCOSAL | Status: AC
  Administered 2019-08-17: 15 mL via OROMUCOSAL
  Filled 2019-08-17: qty 15

## 2019-08-17 MED ORDER — OXYCODONE HCL 5 MG PO TABS: 5.0000 mg | ORAL_TABLET | Freq: Once | ORAL | Status: DC | PRN

## 2019-08-17 MED ORDER — BUPIVACAINE-EPINEPHRINE (PF) 0.5% -1:200000 IJ SOLN
INTRAMUSCULAR | Status: DC | PRN
  Administered 2019-08-17: 30 mL

## 2019-08-17 MED ORDER — PROPOFOL 10 MG/ML IV BOLUS
INTRAVENOUS | Status: AC
  Filled 2019-08-17: qty 20

## 2019-08-17 MED ORDER — OXYCODONE HCL 5 MG/5ML PO SOLN: 5.0000 mg | Freq: Once | ORAL | Status: DC | PRN

## 2019-08-17 MED ORDER — DIPHENHYDRAMINE HCL 50 MG/ML IJ SOLN
INTRAMUSCULAR | Status: AC
  Filled 2019-08-17: qty 1

## 2019-08-17 MED ORDER — DIPHENHYDRAMINE HCL 50 MG/ML IJ SOLN
INTRAMUSCULAR | Status: DC | PRN
  Administered 2019-08-17: 50 mg via INTRAVENOUS

## 2019-08-17 MED ORDER — MIDAZOLAM HCL 2 MG/2ML IJ SOLN
INTRAMUSCULAR | Status: DC | PRN
  Administered 2019-08-17: 2 mg via INTRAVENOUS

## 2019-08-17 MED ORDER — CHLORHEXIDINE GLUCONATE 0.12 % MT SOLN: 15.0000 mL | Freq: Once | OROMUCOSAL | Status: AC

## 2019-08-17 MED ORDER — DEXMEDETOMIDINE HCL IN NACL 200 MCG/50ML IV SOLN
INTRAVENOUS | Status: DC | PRN
  Administered 2019-08-17: 20 ug via INTRAVENOUS
  Administered 2019-08-17: 16 ug via INTRAVENOUS

## 2019-08-17 MED ORDER — ONDANSETRON HCL 4 MG/2ML IJ SOLN: 4.0000 mg | Freq: Once | INTRAMUSCULAR | Status: DC | PRN

## 2019-08-17 MED ORDER — PROPOFOL 10 MG/ML IV BOLUS
INTRAVENOUS | Status: DC | PRN
  Administered 2019-08-17: 180 mg via INTRAVENOUS

## 2019-08-17 MED ORDER — ACETAMINOPHEN 10 MG/ML IV SOLN
INTRAVENOUS | Status: AC
  Filled 2019-08-17: qty 100

## 2019-08-17 MED ORDER — CEFAZOLIN SODIUM-DEXTROSE 2-3 GM-%(50ML) IV SOLR
INTRAVENOUS | Status: DC | PRN
Start: 2019-08-17 — End: 2019-08-17
  Administered 2019-08-17: 2 g via INTRAVENOUS

## 2019-08-17 MED ORDER — CLINDAMYCIN PHOSPHATE 900 MG/50ML IV SOLN
INTRAVENOUS | Status: AC
  Filled 2019-08-17: qty 50

## 2019-08-17 MED ORDER — FAMOTIDINE 20 MG PO TABS
ORAL_TABLET | ORAL | Status: AC
  Administered 2019-08-17: 20 mg via ORAL
  Filled 2019-08-17: qty 1

## 2019-08-17 MED ORDER — ONDANSETRON HCL 4 MG/2ML IJ SOLN
INTRAMUSCULAR | Status: DC | PRN
  Administered 2019-08-17: 4 mg via INTRAVENOUS

## 2019-08-17 MED ORDER — ONDANSETRON HCL 4 MG/2ML IJ SOLN
INTRAMUSCULAR | Status: AC
  Filled 2019-08-17: qty 2

## 2019-08-17 MED ORDER — ACETAMINOPHEN 10 MG/ML IV SOLN
INTRAVENOUS | Status: DC | PRN
  Administered 2019-08-17: 1000 mg via INTRAVENOUS

## 2019-08-17 MED ORDER — CEFAZOLIN SODIUM 1 G IJ SOLR
INTRAMUSCULAR | Status: AC
  Filled 2019-08-17: qty 20

## 2019-08-17 MED ORDER — FENTANYL CITRATE (PF) 100 MCG/2ML IJ SOLN
INTRAMUSCULAR | Status: DC | PRN
  Administered 2019-08-17 (×2): 50 ug via INTRAVENOUS

## 2019-08-17 MED ORDER — DEXAMETHASONE SODIUM PHOSPHATE 10 MG/ML IJ SOLN
INTRAMUSCULAR | Status: AC
  Filled 2019-08-17: qty 1

## 2019-08-17 MED ORDER — FENTANYL CITRATE (PF) 100 MCG/2ML IJ SOLN
25.0000 ug | INTRAMUSCULAR | Status: DC | PRN
  Administered 2019-08-17: 25 ug via INTRAVENOUS

## 2019-08-17 MED ORDER — SODIUM CHLORIDE 0.9 % IV SOLN: INTRAVENOUS | Status: DC

## 2019-08-17 MED ORDER — FAMOTIDINE 20 MG PO TABS: 20.0000 mg | ORAL_TABLET | Freq: Once | ORAL | Status: AC

## 2019-08-17 MED ORDER — ACETAMINOPHEN 10 MG/ML IV SOLN: 1000.0000 mg | Freq: Once | INTRAVENOUS | Status: DC | PRN

## 2019-08-17 MED ORDER — MIDAZOLAM HCL 2 MG/2ML IJ SOLN
INTRAMUSCULAR | Status: AC
  Filled 2019-08-17: qty 2

## 2019-08-17 MED ORDER — ORAL CARE MOUTH RINSE: 15.0000 mL | Freq: Once | OROMUCOSAL | Status: AC

## 2019-08-17 SURGICAL SUPPLY — 43 items
BLADE SURG 15 STRL LF DISP TIS (BLADE) ×1 IMPLANT
BLADE SURG 15 STRL SS (BLADE) ×1
CANISTER SUCT 1200ML W/VALVE (MISCELLANEOUS) ×2 IMPLANT
CHLORAPREP W/TINT 26 (MISCELLANEOUS) ×2 IMPLANT
CNTNR SPEC 2.5X3XGRAD LEK (MISCELLANEOUS) ×1
CONT SPEC 4OZ STER OR WHT (MISCELLANEOUS) ×1
CONTAINER SPEC 2.5X3XGRAD LEK (MISCELLANEOUS) ×1 IMPLANT
COVER WAND RF STERILE (DRAPES) ×2 IMPLANT
DERMABOND ADVANCED (GAUZE/BANDAGES/DRESSINGS) ×1
DERMABOND ADVANCED .7 DNX12 (GAUZE/BANDAGES/DRESSINGS) ×1 IMPLANT
DEVICE DUBIN SPECIMEN MAMMOGRA (MISCELLANEOUS) ×2 IMPLANT
DRAPE 3/4 80X56 (DRAPES) ×2 IMPLANT
DRAPE LAPAROTOMY TRNSV 106X77 (MISCELLANEOUS) ×2 IMPLANT
ELECT CAUTERY BLADE TIP 2.5 (TIP) ×2
ELECT REM PT RETURN 9FT ADLT (ELECTROSURGICAL) ×2
ELECTRODE CAUTERY BLDE TIP 2.5 (TIP) ×1 IMPLANT
ELECTRODE REM PT RTRN 9FT ADLT (ELECTROSURGICAL) ×1 IMPLANT
GLOVE BIO SURGEON STRL SZ 6.5 (GLOVE) ×2 IMPLANT
GLOVE BIOGEL PI IND STRL 6.5 (GLOVE) ×1 IMPLANT
GLOVE BIOGEL PI INDICATOR 6.5 (GLOVE) ×1
GOWN STRL REUS W/ TWL LRG LVL3 (GOWN DISPOSABLE) ×3 IMPLANT
GOWN STRL REUS W/TWL LRG LVL3 (GOWN DISPOSABLE) ×3
KIT MARKER MARGIN INK (KITS) ×2 IMPLANT
KIT TURNOVER KIT A (KITS) ×2 IMPLANT
LABEL OR SOLS (LABEL) ×2 IMPLANT
MARGIN MAP 10MM (MISCELLANEOUS) IMPLANT
MARKER MARGIN CORRECT CLIP (MARKER) ×2 IMPLANT
NEEDLE HYPO 25X1 1.5 SAFETY (NEEDLE) ×2 IMPLANT
PACK BASIN MINOR (MISCELLANEOUS) ×2 IMPLANT
RETRACTOR RING XSMALL (MISCELLANEOUS) IMPLANT
RTRCTR WOUND ALEXIS 13CM XS SH (MISCELLANEOUS)
SET LOCALIZER 20 PROBE US (MISCELLANEOUS) ×2 IMPLANT
SUT ETHILON 3-0 FS-10 30 BLK (SUTURE)
SUT MNCRL 4-0 (SUTURE) ×1
SUT MNCRL 4-0 27XMFL (SUTURE) ×1
SUT SILK 2 0 SH (SUTURE) ×2 IMPLANT
SUT VIC AB 3-0 SH 27 (SUTURE) ×1
SUT VIC AB 3-0 SH 27X BRD (SUTURE) ×1 IMPLANT
SUTURE EHLN 3-0 FS-10 30 BLK (SUTURE) IMPLANT
SUTURE MNCRL 4-0 27XMF (SUTURE) ×1 IMPLANT
SYR 10ML LL (SYRINGE) ×2 IMPLANT
SYR BULB IRRIG 60ML STRL (SYRINGE) ×2 IMPLANT
WATER STERILE IRR 1000ML POUR (IV SOLUTION) ×2 IMPLANT

## 2019-08-17 NOTE — Anesthesia Procedure Notes (Signed)
Procedure Name: Intubation Performed by: Gaynelle Cage, CRNA Pre-anesthesia Checklist: Patient identified, Emergency Drugs available, Suction available and Patient being monitored Patient Re-evaluated:Patient Re-evaluated prior to induction Oxygen Delivery Method: Circle system utilized Preoxygenation: Pre-oxygenation with 100% oxygen Induction Type: IV induction LMA: LMA inserted LMA Size: 3.5 Tube type: Oral Number of attempts: 1 Placement Confirmation: positive ETCO2 and breath sounds checked- equal and bilateral Dental Injury: Teeth and Oropharynx as per pre-operative assessment

## 2019-08-17 NOTE — Anesthesia Preprocedure Evaluation (Signed)
Anesthesia Evaluation  Patient identified by MRN, date of birth, ID band Patient awake    Reviewed: Allergy & Precautions, NPO status , Patient's Chart, lab work & pertinent test results  History of Anesthesia Complications Negative for: history of anesthetic complications  Airway Mallampati: III  TM Distance: >3 FB Neck ROM: Full    Dental no notable dental hx. (+) Dental Advisory Given   Pulmonary asthma (mild intermittent) , neg sleep apnea,  Mild asthma, never needs rescue inhaler. 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, no issues since then   Neuro/Psych  Headaches, negative psych ROS   GI/Hepatic Neg liver ROS, GERD  Controlled,  Endo/Other  diabetes, Insulin Dependent  Renal/GU negative Renal ROS     Musculoskeletal negative musculoskeletal ROS (+)   Abdominal (+) + obese,   Peds  Hematology negative hematology ROS (+)   Anesthesia Other Findings Past Medical History: No date: Allergy No date: Asthma     Comment:  triggered by odors 2014: Cancer ([place])     Comment:  polyp in colon No date: Diabetes mellitus without complication ([place]) No date: GERD (gastroesophageal reflux disease) 1976: H/O scarlet fever No date: Headache     Comment:  migraines, triggered by weather changes 07/28/2015: Pure hypercholesterolemia   Reproductive/Obstetrics                             Anesthesia Physical  Anesthesia Plan  ASA: II  Anesthesia Plan: General   Post-op Pain Management:    Induction: Intravenous  PONV Risk Score and Plan: 3 and Ondansetron, Dexamethasone and  Midazolam  Airway Management Planned: LMA  Additional Equipment: None  Intra-op Plan:   Post-operative Plan: Extubation in OR  Informed Consent: I have reviewed the patients History and Physical, chart, labs and discussed the procedure including the risks, benefits and alternatives for the proposed anesthesia with the patient or authorized representative who has indicated his/her understanding and acceptance.     Dental advisory given  Plan Discussed with: CRNA and Surgeon  Anesthesia Plan Comments: (Discussed risks of anesthesia with patient, including PONV, sore throat, lip/dental damage. Rare risks discussed as well, such as cardiorespiratory and neurological sequelae. Patient understands. Patient has listed allergy to PCN. Severe blistering skin reaction (SJS/TEN)? no Liver or kidney injury caused by PCN? no Hemolytic anemia from PCN? no Drug fever? no Painful swollen joints? no Severe reaction involving inside of mouth, eye, or genital ulcers? No Patient has taken Amoxicillin and Keflex before without issue. Based on current evidence Petra Kuba al, J Allergy Clin Immunol Pract, 2019), will proceed with cefazolin use: Yes  )        Anesthesia Quick Evaluation

## 2019-08-17 NOTE — Anesthesia Postprocedure Evaluation (Signed)
Anesthesia Post Note  Patient: Virginia Rogers  Procedure(s) Performed: BREAST BIOPSY WITH RADIO FREQUENCY LOCALIZER (Right )  Patient location during evaluation: PACU Anesthesia Type: General Level of consciousness: awake and alert Pain management: pain level controlled Vital Signs Assessment: post-procedure vital signs reviewed and stable Respiratory status: spontaneous breathing, nonlabored ventilation, respiratory function stable and patient connected to nasal cannula oxygen Cardiovascular status: blood pressure returned to baseline and stable Postop Assessment: no apparent nausea or vomiting Anesthetic complications: no   No complications documented.   Last Vitals:  Vitals:   08/17/19 1331 08/17/19 1334  BP:  120/72  Pulse: 77 84  Resp: 18 17  Temp:    SpO2: 99% 100%    Last Pain:  Vitals:   08/17/19 1320  TempSrc:   PainSc: 0-No pain                 Arita Miss

## 2019-08-17 NOTE — Op Note (Signed)
Preoperative diagnosis: Right breast radial scar  Postoperative diagnosis: Right breast radial scar.   Procedure: Right radiofrequency tag-localized excisional biopsy.                      Anesthesia: GETA  Surgeon: Dr. Windell Moment  Wound Classification: Clean  Indications: Patient is a 59 y.o. female with a nonpalpable right breast mass noted on mammography with core biopsy demonstrating radial scar requires radiofrequency tag-localized partial mastectomy to rule out malignancy.   Findings: 1. Specimen mammography shows marker and tag on specimen 2. Pathology call refers gross examination of margins was unable to found a mass. Tag and marker centrally located.  3. No other palpable mass or lymph node identified.   Description of procedure: Preoperative radiofrequency tag localization was performed by radiology. Localization studies were reviewed. The patient was taken to the operating room and placed supine on the operating table, and after general anesthesia the right chest and axilla were prepped and draped in the usual sterile fashion. A time-out was completed verifying correct patient, procedure, site, positioning, and implant(s) and/or special equipment prior to beginning this procedure.  By comparing the localization studies and interrogation with Localizer device, the probable trajectory and location of the mass was visualized. A circumareolar skin incision was planned in such a way as to minimize the amount of dissection to reach the mass.  The skin incision was made. Flaps were raised and the location of the tag was confirmed with Localizer device confirmed. A 2-0 silk figure-of-eight stay suture was placed and used for retraction. Dissection was then taken down circumferentially, taking care to include the entire localizing tag and a wide margin of grossly normal tissue. The specimen and entire localizing tag were removed. The specimen was oriented and sent to radiology with the  localization studies. Confirmation was received that the entire target lesion had been resected. The wound was irrigated. Hemostasis was checked. The wound was closed with interrupted sutures of 3-0 Vicryl and a subcuticular suture of Monocryl 3-0. No attempt was made to close the dead space.  The patient tolerated the procedure well and was taken to the postanesthesia care unit in stable condition.   Specimen: Right Breast Excisional Biopsy                      Complications: None  Estimated Blood Loss: 5 mL

## 2019-08-17 NOTE — Interval H&P Note (Signed)
History and Physical Interval Note:  08/17/2019 11:20 AM  Virginia Rogers  has presented today for surgery, with the diagnosis of N64.89 Radial scar of breast-right.  The various methods of treatment have been discussed with the patient and family. After consideration of risks, benefits and other options for treatment, the patient has consented to  Procedure(s): BREAST BIOPSY WITH RADIO FREQUENCY LOCALIZER (Right) as a surgical intervention.  The patient's history has been reviewed, patient examined, no change in status, stable for surgery.  I have reviewed the patient's chart and labs.  Right breast marked in the pre procedure room. Questions were answered to the patient's satisfaction.     Herbert Pun

## 2019-08-17 NOTE — Transfer of Care (Signed)
Immediate Anesthesia Transfer of Care Note  Patient: JOLI KOOB  Procedure(s) Performed: BREAST BIOPSY WITH RADIO FREQUENCY LOCALIZER (Right )  Patient Location: PACU  Anesthesia Type:General  Level of Consciousness: sedated  Airway & Oxygen Therapy: Patient Spontanous Breathing and Patient connected to face mask oxygen  Post-op Assessment: Report given to RN and Post -op Vital signs reviewed and stable  Post vital signs: Reviewed and stable  Last Vitals:  Vitals Value Taken Time  BP 124/77 08/17/19 1318  Temp    Pulse 80 08/17/19 1320  Resp    SpO2 100 % 08/17/19 1320  Vitals shown include unvalidated device data.  Last Pain:  Vitals:   08/17/19 1026  TempSrc: Tympanic  PainSc: 0-No pain         Complications: No complications documented.

## 2019-08-17 NOTE — Discharge Instructions (Signed)
°  Diet: Resume home heart healthy regular diet.  ° °Activity: Increase activity as tolerated. Light activity and walking are encouraged. Do not drive or drink alcohol if taking narcotic pain medications. ° °Wound care: May shower with soapy water and pat dry (do not rub incisions), but no baths or submerging incision underwater until follow-up. (no swimming)  ° °Medications: Resume all home medications. For mild to moderate pain: acetaminophen (Tylenol) or ibuprofen (if no kidney disease). Combining Tylenol with alcohol can substantially increase your risk of causing liver disease. Narcotic pain medications, if prescribed, can be used for severe pain, though may cause nausea, constipation, and drowsiness. Do not combine Tylenol and Norco within a 6 hour period as Norco contains Tylenol. If you do not need the narcotic pain medication, you do not need to fill the prescription. ° °Call office ([phone]) at any time if any questions, worsening pain, fevers/chills, bleeding, drainage from incision site, or other concerns. ° °AMBULATORY SURGERY  °DISCHARGE INSTRUCTIONS ° ° °1) The drugs that you were given will stay in your system until tomorrow so for the next 24 hours you should not: ° °A) Drive an automobile °B) Make any legal decisions °C) Drink any alcoholic beverage ° ° °2) You may resume regular meals tomorrow.  Today it is better to start with liquids and gradually work up to solid foods. ° °You may eat anything you prefer, but it is better to start with liquids, then soup and crackers, and gradually work up to solid foods. ° ° °3) Please notify your doctor immediately if you have any unusual bleeding, trouble breathing, redness and pain at the surgery site, drainage, fever, or pain not relieved by medication. ° ° ° °4) Additional Instructions: ° ° ° ° ° ° ° °Please contact your physician with any problems or Same Day Surgery at [phone], Monday through Friday 6 am to 4 pm, or [facility] [place] [facility]  number at [phone]. °

## 2019-08-18 ENCOUNTER — Encounter: Payer: Self-pay | Admitting: General Surgery

## 2019-08-22 LAB — SURGICAL PATHOLOGY

## 2019-10-12 DIAGNOSIS — E785 Hyperlipidemia, unspecified: Secondary | ICD-10-CM | POA: Diagnosis not present

## 2019-10-12 DIAGNOSIS — E1169 Type 2 diabetes mellitus with other specified complication: Secondary | ICD-10-CM | POA: Diagnosis not present

## 2019-10-19 DIAGNOSIS — E1169 Type 2 diabetes mellitus with other specified complication: Secondary | ICD-10-CM | POA: Diagnosis not present

## 2019-10-19 DIAGNOSIS — E669 Obesity, unspecified: Secondary | ICD-10-CM | POA: Diagnosis not present

## 2019-10-19 DIAGNOSIS — K219 Gastro-esophageal reflux disease without esophagitis: Secondary | ICD-10-CM | POA: Diagnosis not present

## 2019-10-19 DIAGNOSIS — E785 Hyperlipidemia, unspecified: Secondary | ICD-10-CM | POA: Diagnosis not present

## 2019-10-23 DIAGNOSIS — R0600 Dyspnea, unspecified: Secondary | ICD-10-CM | POA: Diagnosis not present

## 2019-10-23 DIAGNOSIS — J4520 Mild intermittent asthma, uncomplicated: Secondary | ICD-10-CM | POA: Diagnosis not present

## 2019-10-31 ENCOUNTER — Telehealth: Payer: Self-pay | Admitting: Internal Medicine

## 2019-11-09 ENCOUNTER — Other Ambulatory Visit: Payer: Self-pay | Admitting: Internal Medicine

## 2019-11-09 ENCOUNTER — Ambulatory Visit: Payer: 59 | Attending: Internal Medicine

## 2019-11-09 DIAGNOSIS — Z23 Encounter for immunization: Secondary | ICD-10-CM

## 2019-11-09 NOTE — Progress Notes (Signed)
° °  Covid-19 Vaccination Clinic  Name:  FUMIYE LUBBEN    MRN: [id number] DOB: 1960/07/21  11/09/2019  Ms. Burson was observed post Covid-19 immunization for 15 minutes without incident. She was provided with Vaccine Information Sheet and instruction to access the V-Safe system.   Ms. Duddy was instructed to call 911 with any severe reactions post vaccine:  Difficulty breathing   Swelling of face and throat   A fast heartbeat   A bad rash all over body   Dizziness and weakness

## 2019-11-16 ENCOUNTER — Other Ambulatory Visit: Payer: Self-pay | Admitting: Family Medicine

## 2019-12-25 ENCOUNTER — Other Ambulatory Visit: Payer: Self-pay | Admitting: "Endocrinology

## 2019-12-25 DIAGNOSIS — E089 Diabetes mellitus due to underlying condition without complications: Secondary | ICD-10-CM | POA: Diagnosis not present

## 2019-12-25 DIAGNOSIS — H5213 Myopia, bilateral: Secondary | ICD-10-CM | POA: Diagnosis not present

## 2019-12-25 DIAGNOSIS — E1169 Type 2 diabetes mellitus with other specified complication: Secondary | ICD-10-CM | POA: Diagnosis not present

## 2019-12-25 DIAGNOSIS — E785 Hyperlipidemia, unspecified: Secondary | ICD-10-CM | POA: Diagnosis not present

## 2019-12-25 DIAGNOSIS — E119 Type 2 diabetes mellitus without complications: Secondary | ICD-10-CM | POA: Diagnosis not present

## 2020-02-21 ENCOUNTER — Other Ambulatory Visit: Payer: Self-pay | Admitting: Specialist

## 2020-04-10 DIAGNOSIS — E1169 Type 2 diabetes mellitus with other specified complication: Secondary | ICD-10-CM | POA: Diagnosis not present

## 2020-04-10 DIAGNOSIS — E669 Obesity, unspecified: Secondary | ICD-10-CM | POA: Diagnosis not present

## 2020-04-10 DIAGNOSIS — E785 Hyperlipidemia, unspecified: Secondary | ICD-10-CM | POA: Diagnosis not present

## 2020-04-15 ENCOUNTER — Other Ambulatory Visit (HOSPITAL_COMMUNITY): Payer: Self-pay | Admitting: Pharmacist

## 2020-04-17 ENCOUNTER — Other Ambulatory Visit: Payer: Self-pay | Admitting: Family Medicine

## 2020-04-17 DIAGNOSIS — E669 Obesity, unspecified: Secondary | ICD-10-CM | POA: Diagnosis not present

## 2020-04-17 DIAGNOSIS — J4520 Mild intermittent asthma, uncomplicated: Secondary | ICD-10-CM | POA: Diagnosis not present

## 2020-04-17 DIAGNOSIS — E1169 Type 2 diabetes mellitus with other specified complication: Secondary | ICD-10-CM | POA: Diagnosis not present

## 2020-04-17 DIAGNOSIS — Z0000 Encounter for general adult medical examination without abnormal findings: Secondary | ICD-10-CM | POA: Diagnosis not present

## 2020-04-22 ENCOUNTER — Other Ambulatory Visit: Payer: Self-pay | Admitting: Specialist

## 2020-04-22 DIAGNOSIS — J209 Acute bronchitis, unspecified: Secondary | ICD-10-CM | POA: Diagnosis not present

## 2020-04-22 DIAGNOSIS — J45909 Unspecified asthma, uncomplicated: Secondary | ICD-10-CM | POA: Diagnosis not present

## 2020-04-23 ENCOUNTER — Other Ambulatory Visit: Payer: Self-pay | Admitting: "Endocrinology

## 2020-04-23 DIAGNOSIS — E785 Hyperlipidemia, unspecified: Secondary | ICD-10-CM | POA: Diagnosis not present

## 2020-04-23 DIAGNOSIS — E1169 Type 2 diabetes mellitus with other specified complication: Secondary | ICD-10-CM | POA: Diagnosis not present

## 2020-05-15 ENCOUNTER — Other Ambulatory Visit: Payer: Self-pay

## 2020-05-15 MED FILL — Dulaglutide Soln Auto-injector 4.5 MG/0.5ML: SUBCUTANEOUS | 84 days supply | Qty: 6 | Fill #0 | Status: AC

## 2020-05-16 ENCOUNTER — Other Ambulatory Visit: Payer: Self-pay

## 2020-06-10 ENCOUNTER — Other Ambulatory Visit: Payer: Self-pay

## 2020-06-10 ENCOUNTER — Ambulatory Visit: Payer: 59 | Attending: Internal Medicine

## 2020-06-10 DIAGNOSIS — Z23 Encounter for immunization: Secondary | ICD-10-CM

## 2020-06-10 MED ORDER — PFIZER-BIONT COVID-19 VAC-TRIS 30 MCG/0.3ML IM SUSP
INTRAMUSCULAR | 0 refills | Status: DC
Start: 2020-06-10 — End: 2021-06-29
  Filled 2020-06-10: qty 0.3, 1d supply, fill #0

## 2020-06-10 NOTE — Progress Notes (Signed)
   Covid-19 Vaccination Clinic  Name:  Virginia Rogers    MRN: [id number] DOB: 1960-10-27  06/10/2020  [name] was observed post Covid-19 immunization for 15 minutes without incident. She was provided with Vaccine Information Sheet and instruction to access the V-Safe system.   [name] was instructed to call 911 with any severe reactions post vaccine: Marland Kitchen Difficulty breathing  . Swelling of face and throat  . A fast heartbeat  . A bad rash all over body  . Dizziness and weakness   Immunizations Administered    Name Date Dose VIS Date Route   PFIZER Comrnaty(Gray TOP) Covid-19 Vaccine 06/10/2020  2:14 PM 0.3 mL 01/17/2020 Intramuscular   Manufacturer: [employer]   Lot: [id number]   NDC: [place], PharmD, MBA Clinical Pharmacist

## 2020-06-11 ENCOUNTER — Other Ambulatory Visit: Payer: Self-pay

## 2020-06-11 MED ORDER — NURTEC 75 MG PO TBDP
ORAL_TABLET | ORAL | 0 refills | Status: DC
  Filled 2020-06-11: qty 8, 30d supply, fill #0

## 2020-06-19 ENCOUNTER — Other Ambulatory Visit: Payer: Self-pay

## 2020-06-20 ENCOUNTER — Other Ambulatory Visit: Payer: Self-pay

## 2020-06-20 MED ORDER — EMPAGLIFLOZIN 25 MG PO TABS
ORAL_TABLET | Freq: Every day | ORAL | 4 refills | Status: DC
  Filled 2020-06-20: qty 90, 90d supply, fill #0
  Filled 2020-09-22: qty 90, 90d supply, fill #1
  Filled 2020-12-15: qty 90, 90d supply, fill #2
  Filled 2021-03-10: qty 90, 90d supply, fill #3
  Filled 2021-06-11: qty 90, 90d supply, fill #4

## 2020-06-27 ENCOUNTER — Other Ambulatory Visit: Payer: Self-pay

## 2020-06-27 DIAGNOSIS — M5442 Lumbago with sciatica, left side: Secondary | ICD-10-CM | POA: Diagnosis not present

## 2020-06-27 MED ORDER — TIZANIDINE HCL 2 MG PO TABS
ORAL_TABLET | ORAL | 0 refills | Status: DC
  Filled 2020-06-27: qty 30, 10d supply, fill #0

## 2020-06-27 MED ORDER — MELOXICAM 15 MG PO TABS
ORAL_TABLET | ORAL | 0 refills | Status: DC
  Filled 2020-06-27: qty 21, 21d supply, fill #0

## 2020-08-09 MED FILL — Dulaglutide Soln Auto-injector 4.5 MG/0.5ML: SUBCUTANEOUS | 84 days supply | Qty: 6 | Fill #1 | Status: AC

## 2020-08-12 ENCOUNTER — Other Ambulatory Visit: Payer: Self-pay

## 2020-08-12 DIAGNOSIS — E119 Type 2 diabetes mellitus without complications: Secondary | ICD-10-CM | POA: Diagnosis not present

## 2020-08-12 DIAGNOSIS — E1169 Type 2 diabetes mellitus with other specified complication: Secondary | ICD-10-CM | POA: Diagnosis not present

## 2020-08-12 DIAGNOSIS — E785 Hyperlipidemia, unspecified: Secondary | ICD-10-CM | POA: Diagnosis not present

## 2020-08-13 ENCOUNTER — Other Ambulatory Visit: Payer: Self-pay

## 2020-08-15 ENCOUNTER — Other Ambulatory Visit (HOSPITAL_COMMUNITY): Payer: Self-pay

## 2020-08-19 ENCOUNTER — Other Ambulatory Visit: Payer: Self-pay

## 2020-08-19 DIAGNOSIS — R21 Rash and other nonspecific skin eruption: Secondary | ICD-10-CM | POA: Diagnosis not present

## 2020-08-19 DIAGNOSIS — E669 Obesity, unspecified: Secondary | ICD-10-CM | POA: Diagnosis not present

## 2020-08-19 DIAGNOSIS — E1169 Type 2 diabetes mellitus with other specified complication: Secondary | ICD-10-CM | POA: Diagnosis not present

## 2020-08-19 DIAGNOSIS — E785 Hyperlipidemia, unspecified: Secondary | ICD-10-CM | POA: Diagnosis not present

## 2020-08-19 MED ORDER — TRIAMCINOLONE ACETONIDE 0.5 % EX CREA
TOPICAL_CREAM | CUTANEOUS | 0 refills | Status: AC
  Filled 2020-08-19: qty 30, 10d supply, fill #0

## 2020-08-28 ENCOUNTER — Other Ambulatory Visit: Payer: Self-pay

## 2020-08-28 DIAGNOSIS — E1169 Type 2 diabetes mellitus with other specified complication: Secondary | ICD-10-CM | POA: Diagnosis not present

## 2020-08-28 DIAGNOSIS — E785 Hyperlipidemia, unspecified: Secondary | ICD-10-CM | POA: Diagnosis not present

## 2020-08-28 MED ORDER — MOUNJARO 10 MG/0.5ML ~~LOC~~ SOAJ
SUBCUTANEOUS | 6 refills | Status: DC
  Filled 2020-08-28: qty 2, 28d supply, fill #0
  Filled 2020-10-03: qty 2, 28d supply, fill #1

## 2020-08-29 ENCOUNTER — Other Ambulatory Visit: Payer: Self-pay

## 2020-09-02 ENCOUNTER — Other Ambulatory Visit: Payer: Self-pay

## 2020-09-02 MED ORDER — LOSARTAN POTASSIUM 25 MG PO TABS
ORAL_TABLET | ORAL | 0 refills | Status: DC
  Filled 2020-09-02: qty 90, 90d supply, fill #0

## 2020-09-02 MED FILL — Rosuvastatin Calcium Tab 5 MG: ORAL | 90 days supply | Qty: 90 | Fill #0 | Status: AC

## 2020-09-22 ENCOUNTER — Other Ambulatory Visit: Payer: Self-pay

## 2020-10-02 ENCOUNTER — Other Ambulatory Visit: Payer: Self-pay

## 2020-10-02 MED ORDER — CARESTART COVID-19 HOME TEST VI KIT
PACK | 0 refills | Status: DC
  Filled 2020-10-02: qty 2, 4d supply, fill #0

## 2020-10-03 ENCOUNTER — Other Ambulatory Visit: Payer: Self-pay

## 2020-10-03 MED ORDER — ZOSTER VAC RECOMB ADJUVANTED 50 MCG/0.5ML IM SUSR
INTRAMUSCULAR | 1 refills | Status: DC
  Filled 2020-10-03: qty 0.5, 1d supply, fill #0
  Filled 2020-12-03 – 2020-12-05 (×2): qty 0.5, 1d supply, fill #1

## 2020-10-06 ENCOUNTER — Other Ambulatory Visit: Payer: Self-pay

## 2020-10-07 ENCOUNTER — Other Ambulatory Visit: Payer: Self-pay

## 2020-10-08 ENCOUNTER — Other Ambulatory Visit: Payer: Self-pay

## 2020-10-08 MED ORDER — CARESTART COVID-19 HOME TEST VI KIT
PACK | 0 refills | Status: DC
  Filled 2020-10-08: qty 2, 4d supply, fill #0

## 2020-10-09 ENCOUNTER — Other Ambulatory Visit: Payer: Self-pay | Admitting: Family Medicine

## 2020-10-09 DIAGNOSIS — Z1231 Encounter for screening mammogram for malignant neoplasm of breast: Secondary | ICD-10-CM

## 2020-10-10 ENCOUNTER — Other Ambulatory Visit: Payer: Self-pay | Admitting: Family Medicine

## 2020-10-10 DIAGNOSIS — Z1231 Encounter for screening mammogram for malignant neoplasm of breast: Secondary | ICD-10-CM

## 2020-10-20 ENCOUNTER — Other Ambulatory Visit: Payer: Self-pay

## 2020-10-20 MED ORDER — NURTEC 75 MG PO TBDP
ORAL_TABLET | ORAL | 0 refills | Status: DC
  Filled 2020-10-20: qty 8, 16d supply, fill #0

## 2020-10-21 ENCOUNTER — Other Ambulatory Visit: Payer: Self-pay

## 2020-10-21 MED ORDER — PREVNAR 20 0.5 ML IM SUSY
0.5000 mL | PREFILLED_SYRINGE | Freq: Once | INTRAMUSCULAR | 0 refills | Status: AC
  Filled 2020-10-21 – 2020-10-24 (×3): qty 0.5, 1d supply, fill #0

## 2020-10-22 ENCOUNTER — Other Ambulatory Visit: Payer: Self-pay

## 2020-10-23 ENCOUNTER — Other Ambulatory Visit: Payer: Self-pay

## 2020-10-23 MED ORDER — MOUNJARO 12.5 MG/0.5ML ~~LOC~~ SOAJ
SUBCUTANEOUS | 6 refills | Status: DC
  Filled 2020-10-23 – 2020-10-24 (×2): qty 2, 28d supply, fill #0

## 2020-10-24 ENCOUNTER — Other Ambulatory Visit: Payer: Self-pay

## 2020-10-30 ENCOUNTER — Other Ambulatory Visit: Payer: Self-pay

## 2020-10-30 MED ORDER — MONTELUKAST SODIUM 10 MG PO TABS
10.0000 mg | ORAL_TABLET | Freq: Every day | ORAL | 3 refills | Status: DC
  Filled 2020-10-30: qty 90, 90d supply, fill #0

## 2020-10-30 MED FILL — Budesonide-Formoterol Fumarate Dihyd Aerosol 160-4.5 MCG/ACT: RESPIRATORY_TRACT | 30 days supply | Qty: 10.2 | Fill #0 | Status: AC

## 2020-11-12 ENCOUNTER — Other Ambulatory Visit: Payer: Self-pay

## 2020-11-12 ENCOUNTER — Ambulatory Visit
Admission: RE | Admit: 2020-11-12 | Discharge: 2020-11-12 | Disposition: A | Discharge status: Home / Self Care | Payer: 59 | Source: Ambulatory Visit | Attending: Family Medicine | Admitting: Family Medicine

## 2020-11-12 DIAGNOSIS — Z1231 Encounter for screening mammogram for malignant neoplasm of breast: Secondary | ICD-10-CM | POA: Diagnosis not present

## 2020-11-14 ENCOUNTER — Other Ambulatory Visit: Payer: Self-pay

## 2020-11-14 ENCOUNTER — Ambulatory Visit: Payer: 59 | Attending: Internal Medicine

## 2020-11-14 DIAGNOSIS — Z23 Encounter for immunization: Secondary | ICD-10-CM

## 2020-11-14 MED ORDER — PFIZER COVID-19 VAC BIVALENT 30 MCG/0.3ML IM SUSP
INTRAMUSCULAR | 0 refills | Status: DC
  Filled 2020-11-14: qty 0.3, 1d supply, fill #0

## 2020-11-14 NOTE — Progress Notes (Signed)
   Covid-19 Vaccination Clinic  Name:  Virginia Rogers    MRN: [id number] DOB: 03-02-1960  11/14/2020  [name] was observed post Covid-19 immunization for 15 minutes without incident. She was provided with Vaccine Information Sheet and instruction to access the V-Safe system.   [name] was instructed to call 911 with any severe reactions post vaccine: Difficulty breathing  Swelling of face and throat  A fast heartbeat  A bad rash all over body  Dizziness and weakness   Lu Duffel, PharmD, MBA Clinical Acute Care Pharmacist

## 2020-11-20 ENCOUNTER — Other Ambulatory Visit: Payer: Self-pay

## 2020-11-20 MED ORDER — MOUNJARO 15 MG/0.5ML ~~LOC~~ SOAJ
SUBCUTANEOUS | 12 refills | Status: DC
  Filled 2020-11-20: qty 2, 28d supply, fill #0
  Filled 2020-12-15: qty 2, 28d supply, fill #1
  Filled 2021-01-12: qty 2, 28d supply, fill #2
  Filled 2021-02-10: qty 2, 28d supply, fill #3
  Filled 2021-03-10: qty 2, 28d supply, fill #4
  Filled 2021-04-07: qty 6, 84d supply, fill #5
  Filled 2021-06-25: qty 6, 84d supply, fill #6

## 2020-11-21 ENCOUNTER — Other Ambulatory Visit: Payer: Self-pay

## 2020-12-01 ENCOUNTER — Other Ambulatory Visit: Payer: Self-pay

## 2020-12-03 ENCOUNTER — Other Ambulatory Visit: Payer: Self-pay

## 2020-12-03 MED FILL — Rosuvastatin Calcium Tab 5 MG: ORAL | 90 days supply | Qty: 90 | Fill #1 | Status: AC

## 2020-12-04 ENCOUNTER — Other Ambulatory Visit: Payer: Self-pay

## 2020-12-04 MED ORDER — LOSARTAN POTASSIUM 25 MG PO TABS
ORAL_TABLET | ORAL | 0 refills | Status: DC
  Filled 2020-12-04: qty 90, 90d supply, fill #0

## 2020-12-05 ENCOUNTER — Other Ambulatory Visit: Payer: Self-pay

## 2020-12-15 ENCOUNTER — Other Ambulatory Visit: Payer: Self-pay

## 2020-12-16 ENCOUNTER — Other Ambulatory Visit: Payer: Self-pay

## 2020-12-31 DIAGNOSIS — E1169 Type 2 diabetes mellitus with other specified complication: Secondary | ICD-10-CM | POA: Diagnosis not present

## 2020-12-31 DIAGNOSIS — E785 Hyperlipidemia, unspecified: Secondary | ICD-10-CM | POA: Diagnosis not present

## 2021-01-08 DIAGNOSIS — H5213 Myopia, bilateral: Secondary | ICD-10-CM | POA: Diagnosis not present

## 2021-01-09 ENCOUNTER — Other Ambulatory Visit: Payer: Self-pay

## 2021-01-12 ENCOUNTER — Other Ambulatory Visit: Payer: Self-pay

## 2021-02-10 ENCOUNTER — Other Ambulatory Visit: Payer: Self-pay

## 2021-02-20 DIAGNOSIS — E785 Hyperlipidemia, unspecified: Secondary | ICD-10-CM | POA: Diagnosis not present

## 2021-02-20 DIAGNOSIS — E1169 Type 2 diabetes mellitus with other specified complication: Secondary | ICD-10-CM | POA: Diagnosis not present

## 2021-02-25 ENCOUNTER — Other Ambulatory Visit: Payer: Self-pay

## 2021-02-25 DIAGNOSIS — E785 Hyperlipidemia, unspecified: Secondary | ICD-10-CM | POA: Diagnosis not present

## 2021-02-25 DIAGNOSIS — E1169 Type 2 diabetes mellitus with other specified complication: Secondary | ICD-10-CM | POA: Diagnosis not present

## 2021-02-25 DIAGNOSIS — E6609 Other obesity due to excess calories: Secondary | ICD-10-CM | POA: Diagnosis not present

## 2021-02-25 DIAGNOSIS — Z6834 Body mass index (BMI) 34.0-34.9, adult: Secondary | ICD-10-CM | POA: Diagnosis not present

## 2021-02-25 MED ORDER — ROSUVASTATIN CALCIUM 5 MG PO TABS
ORAL_TABLET | ORAL | 1 refills | Status: DC
  Filled 2021-02-25: qty 90, 90d supply, fill #0
  Filled 2021-10-05: qty 90, 90d supply, fill #1

## 2021-02-25 MED ORDER — LOSARTAN POTASSIUM 25 MG PO TABS
25.0000 mg | ORAL_TABLET | Freq: Every day | ORAL | 1 refills | Status: DC
  Filled 2021-02-25: qty 90, 90d supply, fill #0
  Filled 2021-10-05: qty 90, 90d supply, fill #1

## 2021-03-11 ENCOUNTER — Other Ambulatory Visit: Payer: Self-pay

## 2021-04-07 ENCOUNTER — Other Ambulatory Visit: Payer: Self-pay

## 2021-04-08 ENCOUNTER — Other Ambulatory Visit: Payer: Self-pay

## 2021-04-21 ENCOUNTER — Other Ambulatory Visit: Payer: Self-pay

## 2021-04-21 MED ORDER — RIMEGEPANT SULFATE 75 MG PO TBDP
ORAL_TABLET | ORAL | 0 refills | Status: DC
  Filled 2021-04-21: qty 8, 8d supply, fill #0

## 2021-06-04 ENCOUNTER — Other Ambulatory Visit: Payer: Self-pay

## 2021-06-11 ENCOUNTER — Other Ambulatory Visit: Payer: Self-pay

## 2021-06-26 ENCOUNTER — Other Ambulatory Visit: Payer: Self-pay

## 2021-06-29 ENCOUNTER — Other Ambulatory Visit: Payer: Self-pay

## 2021-06-29 ENCOUNTER — Encounter: Payer: Self-pay | Admitting: Emergency Medicine

## 2021-06-29 ENCOUNTER — Emergency Department
Admission: EM | Admit: 2021-06-29 | Discharge: 2021-06-29 | Disposition: A | Discharge status: Home / Self Care | Payer: 59 | Attending: Emergency Medicine | Admitting: Emergency Medicine

## 2021-06-29 DIAGNOSIS — E119 Type 2 diabetes mellitus without complications: Secondary | ICD-10-CM | POA: Insufficient documentation

## 2021-06-29 DIAGNOSIS — K529 Noninfective gastroenteritis and colitis, unspecified: Secondary | ICD-10-CM | POA: Insufficient documentation

## 2021-06-29 DIAGNOSIS — E876 Hypokalemia: Secondary | ICD-10-CM | POA: Diagnosis not present

## 2021-06-29 DIAGNOSIS — R197 Diarrhea, unspecified: Secondary | ICD-10-CM | POA: Diagnosis present

## 2021-06-29 LAB — URINALYSIS, ROUTINE W REFLEX MICROSCOPIC
Bacteria, UA: NONE SEEN
Bilirubin Urine: NEGATIVE
Glucose, UA: >=500 mg/dL — AB
Hgb urine dipstick: NEGATIVE
Ketones, ur: NEGATIVE mg/dL
Leukocytes,Ua: NEGATIVE
Nitrite: NEGATIVE
Protein, ur: NEGATIVE mg/dL
Specific Gravity, Urine: 1.037 — ABNORMAL HIGH (ref 1.005–1.030)
pH: 5 (ref 5.0–8.0)

## 2021-06-29 LAB — COMPREHENSIVE METABOLIC PANEL
ALT: 19 U/L (ref 0–44)
AST: 17 U/L (ref 15–41)
Albumin: 4.2 g/dL (ref 3.5–5.0)
Alkaline Phosphatase: 80 U/L (ref 38–126)
Anion gap: 9 (ref 5–15)
BUN: 13 mg/dL (ref 8–23)
CO2: 21 mmol/L — ABNORMAL LOW (ref 22–32)
Calcium: 9.1 mg/dL (ref 8.9–10.3)
Chloride: 111 mmol/L (ref 98–111)
Creatinine, Ser: 0.55 mg/dL (ref 0.44–1.00)
GFR, Estimated: >60 mL/min (ref >60)
Glucose, Bld: 106 mg/dL — ABNORMAL HIGH (ref 70–99)
Potassium: 3.2 mmol/L — ABNORMAL LOW (ref 3.5–5.1)
Sodium: 141 mmol/L (ref 135–145)
Total Bilirubin: 0.8 mg/dL (ref 0.3–1.2)
Total Protein: 7.4 g/dL (ref 6.5–8.1)

## 2021-06-29 LAB — CBC
HCT: 43.5 % (ref 36.0–46.0)
Hemoglobin: 14.9 g/dL (ref 12.0–15.0)
MCH: 30.7 pg (ref 26.0–34.0)
MCHC: 34.3 g/dL (ref 30.0–36.0)
MCV: 89.5 fL (ref 80.0–100.0)
Platelets: 375 10*3/uL (ref 150–400)
RBC: 4.86 MIL/uL (ref 3.87–5.11)
RDW: 12.9 % (ref 11.5–15.5)
WBC: 6.8 10*3/uL (ref 4.0–10.5)
nRBC: 0 % (ref 0.0–0.2)

## 2021-06-29 LAB — LIPASE, BLOOD: Lipase: 29 U/L (ref 11–51)

## 2021-06-29 MED ORDER — LACTATED RINGERS IV BOLUS
1000.0000 mL | Freq: Once | INTRAVENOUS | Status: AC
  Administered 2021-06-29: 1000 mL via INTRAVENOUS

## 2021-06-29 MED ORDER — ONDANSETRON 4 MG PO TBDP
4.0000 mg | ORAL_TABLET | Freq: Three times a day (TID) | ORAL | 0 refills | Status: DC | PRN
  Filled 2021-06-29: qty 12, 4d supply, fill #0

## 2021-06-29 MED ORDER — ONDANSETRON HCL 4 MG/2ML IJ SOLN
4.0000 mg | Freq: Once | INTRAMUSCULAR | Status: AC
  Administered 2021-06-29: 4 mg via INTRAVENOUS
  Filled 2021-06-29: qty 2

## 2021-06-29 MED ORDER — POTASSIUM CHLORIDE CRYS ER 20 MEQ PO TBCR
40.0000 meq | EXTENDED_RELEASE_TABLET | Freq: Once | ORAL | Status: AC
  Administered 2021-06-29: 40 meq via ORAL
  Filled 2021-06-29: qty 2

## 2021-06-29 NOTE — ED Notes (Signed)
Able to tolerate PO liquids. No diarrhea since time of stool sample order.

## 2021-06-29 NOTE — ED Notes (Signed)
See triage note  presents with a 3 day hx of diarrhea   no fever  positive nausea this am  states she did have some vomiting  has not been able ot keep fluids down  she has taken Pepto and Lomotil for diarrhea w/o relief

## 2021-06-29 NOTE — ED Provider Notes (Signed)
[facility] Provider Note    Event Date/Time   First MD Initiated Contact with Patient 06/29/21 [phone]     (approximate)   History   Chief Complaint Diarrhea   HPI  Virginia Rogers is a 61 y.o. female with past medical history of hyperlipidemia, diabetes, and GERD who presents to the ED complaining of diarrhea.  Patient reports that she has had 3 to 4 days of persistent and frequent watery stools.  This is been associated with crampy pain in her lower abdomen as well as occasional nausea and vomiting.  She does state that she has been able to keep down liquids along with small amounts of solids during this time, has been trying to keep up with Gatorade and water.  She denies any fevers, dysuria, or flank pain.  She denies any recent travel outside of the country and has not had any blood in her emesis or stool.  She has been taking Imodium and Lomotil without significant relief in her symptoms, denies any recent courses of antibiotics.     Physical Exam   Triage Vital Signs: ED Triage Vitals  Enc Vitals Group     BP 06/29/21 0635 (!) 147/87     Pulse Rate 06/29/21 0635 97     Resp 06/29/21 0635 18     Temp 06/29/21 0635 98.3 F (36.8 C)     Temp src --      SpO2 06/29/21 0635 97 %     Weight 06/29/21 0636 222 lb 7.1 oz (100.9 kg)     Height 06/29/21 0636 '5\' 5"'$  (1.651 m)     Head Circumference --      Peak Flow --      Pain Score --      Pain Loc --      Pain Edu? --      Excl. in [place]? --     Most recent vital signs: Vitals:   06/29/21 0635 06/29/21 0852  BP: (!) 147/87 121/69  Pulse: 97 84  Resp: 18 18  Temp: 98.3 F (36.8 C)   SpO2: 97% 98%    Constitutional: Alert and oriented. Eyes: Conjunctivae are normal. Head: Atraumatic. Nose: No congestion/rhinnorhea. Mouth/Throat: Mucous membranes are moist.  Cardiovascular: Normal rate, regular rhythm. Grossly normal heart sounds.  2+ radial pulses bilaterally. Respiratory: Normal respiratory  effort.  No retractions. Lungs CTAB. Gastrointestinal: Soft and nontender. No distention. Musculoskeletal: No lower extremity tenderness nor edema.  Neurologic:  Normal speech and language. No gross focal neurologic deficits are appreciated.    ED Results / Procedures / Treatments   Labs (all labs ordered are listed, but only abnormal results are displayed) Labs Reviewed  COMPREHENSIVE METABOLIC PANEL - Abnormal; Notable for the following components:      Result Value   Potassium 3.2 (*)    CO2 21 (*)    Glucose, Bld 106 (*)    All other components within normal limits  URINALYSIS, ROUTINE W REFLEX MICROSCOPIC - Abnormal; Notable for the following components:   Color, Urine YELLOW (*)    APPearance HAZY (*)    Specific Gravity, Urine 1.037 (*)    Glucose, UA >=500 (*)    All other components within normal limits  GASTROINTESTINAL PANEL BY PCR, STOOL (REPLACES STOOL CULTURE)  C DIFFICILE QUICK SCREEN W PCR REFLEX    LIPASE, BLOOD  CBC    PROCEDURES:  Critical Care performed: No  Procedures   MEDICATIONS ORDERED IN ED: Medications  potassium chloride SA (KLOR-CON M) CR tablet 40 mEq (40 mEq Oral Given 06/29/21 0843)  lactated ringers bolus 1,000 mL (1,000 mLs Intravenous New Bag/Given 06/29/21 0842)  ondansetron (ZOFRAN) injection 4 mg (4 mg Intravenous Given 06/29/21 0843)     IMPRESSION / MDM / ASSESSMENT AND PLAN / ED COURSE  I reviewed the triage vital signs and the nursing notes.                              61 y.o. female with past medical history of hyperlipidemia, diabetes, and GERD who presents to the ED complaining of 4 days of persistent watery diarrhea associated with nausea and occasional vomiting.  Differential diagnosis includes, but is not limited to, gastroenteritis, diverticulitis, GERD, C. difficile, dehydration, electrolyte abnormality.  Patient well-appearing and in no acute distress, vital signs are unremarkable and she has a benign abdominal  exam.  Initial labs are reassuring with CBC showing no anemia or leukocytosis, BMP remarkable only for mild hypokalemia but no AKI.  LFTs and lipase are within normal limits and given benign abdominal exam, no indication for CT imaging of her abdomen at this time.  Symptoms are most consistent with a gastroenteritis and we will treat symptomatically with IV fluid and Zofran.  We will attempt to obtain stool sample here in the ED but overall low suspicion for C. difficile given no recent antibiotic course and no prior history of C. difficile.  Patient unable to provide stool sample, but states she is feeling much better following Zofran and IV fluids, is now requesting to be discharged home.  Urinalysis shows no signs of infection.  She was counseled to follow-up with her PCP and to return to the ED for new worsening symptoms.  Patient agrees with plan.      FINAL CLINICAL IMPRESSION(S) / ED DIAGNOSES   Final diagnoses:  Diarrhea, unspecified type  Gastroenteritis     Rx / DC Orders   ED Discharge Orders          Ordered    ondansetron (ZOFRAN-ODT) 4 MG disintegrating tablet  Every 8 hours PRN        06/29/21 1029             Note:  This document was prepared using Dragon voice recognition software and may include unintentional dictation errors.   Blake Divine, MD 06/29/21 1031

## 2021-06-29 NOTE — ED Triage Notes (Signed)
Pt presents via POV with complaints of diarrhea that started 4 days ago with associated nausea. PTA pt has tried Imodium & Pepto without relief. Denies abdominal pain, CP or SOB.

## 2021-06-29 NOTE — ED Notes (Signed)
Verified correct patient and correct discharge papers given. Pt alert and oriented X 4, stable for discharge. RR even and unlabored, color WNL. Discussed discharge instructions and follow-up as directed. Discharge medications discussed, when prescribed. Pt had opportunity to ask questions, and RN available to provide patient and/or family education.   

## 2021-07-01 ENCOUNTER — Other Ambulatory Visit: Payer: Self-pay

## 2021-07-01 DIAGNOSIS — E1169 Type 2 diabetes mellitus with other specified complication: Secondary | ICD-10-CM | POA: Diagnosis not present

## 2021-07-01 DIAGNOSIS — E785 Hyperlipidemia, unspecified: Secondary | ICD-10-CM | POA: Diagnosis not present

## 2021-07-01 MED ORDER — MOUNJARO 10 MG/0.5ML ~~LOC~~ SOAJ
SUBCUTANEOUS | 12 refills | Status: AC
  Filled 2021-07-01: qty 2, 28d supply, fill #0
  Filled 2021-07-30: qty 2, 28d supply, fill #1
  Filled 2021-08-19: qty 2, 28d supply, fill #2
  Filled 2021-09-29: qty 2, 28d supply, fill #3
  Filled 2021-10-26: qty 2, 28d supply, fill #4
  Filled 2021-11-25: qty 2, 28d supply, fill #5
  Filled 2021-12-22: qty 2, 28d supply, fill #6

## 2021-07-30 ENCOUNTER — Other Ambulatory Visit: Payer: Self-pay

## 2021-08-19 ENCOUNTER — Other Ambulatory Visit: Payer: Self-pay

## 2021-08-20 DIAGNOSIS — E1169 Type 2 diabetes mellitus with other specified complication: Secondary | ICD-10-CM | POA: Diagnosis not present

## 2021-08-20 DIAGNOSIS — E785 Hyperlipidemia, unspecified: Secondary | ICD-10-CM | POA: Diagnosis not present

## 2021-08-21 ENCOUNTER — Other Ambulatory Visit: Payer: Self-pay

## 2021-08-27 ENCOUNTER — Other Ambulatory Visit: Payer: Self-pay

## 2021-08-27 DIAGNOSIS — E785 Hyperlipidemia, unspecified: Secondary | ICD-10-CM | POA: Diagnosis not present

## 2021-08-27 DIAGNOSIS — Z6833 Body mass index (BMI) 33.0-33.9, adult: Secondary | ICD-10-CM | POA: Diagnosis not present

## 2021-08-27 DIAGNOSIS — Z0000 Encounter for general adult medical examination without abnormal findings: Secondary | ICD-10-CM | POA: Diagnosis not present

## 2021-08-27 DIAGNOSIS — E1169 Type 2 diabetes mellitus with other specified complication: Secondary | ICD-10-CM | POA: Diagnosis not present

## 2021-08-27 DIAGNOSIS — E6609 Other obesity due to excess calories: Secondary | ICD-10-CM | POA: Diagnosis not present

## 2021-08-27 MED ORDER — EPINEPHRINE 0.3 MG/0.3ML IJ SOAJ
INTRAMUSCULAR | 1 refills | Status: DC
  Filled 2021-08-27: qty 2, 30d supply, fill #0

## 2021-08-27 MED ORDER — NURTEC 75 MG PO TBDP
ORAL_TABLET | ORAL | 0 refills | Status: DC
  Filled 2021-08-27: qty 8, 15d supply, fill #0

## 2021-09-11 ENCOUNTER — Other Ambulatory Visit: Payer: Self-pay

## 2021-09-11 MED ORDER — JARDIANCE 25 MG PO TABS
25.0000 mg | ORAL_TABLET | Freq: Every day | ORAL | 4 refills | Status: DC
  Filled 2021-09-11: qty 90, 90d supply, fill #0
  Filled 2021-12-11: qty 90, 90d supply, fill #1
  Filled 2022-03-05: qty 90, 90d supply, fill #2
  Filled 2022-05-31: qty 90, 90d supply, fill #3
  Filled 2022-08-19 (×2): qty 90, 90d supply, fill #4

## 2021-09-23 ENCOUNTER — Other Ambulatory Visit: Payer: Self-pay

## 2021-09-24 ENCOUNTER — Other Ambulatory Visit: Payer: Self-pay

## 2021-09-29 ENCOUNTER — Other Ambulatory Visit: Payer: Self-pay

## 2021-10-05 ENCOUNTER — Other Ambulatory Visit: Payer: Self-pay

## 2021-10-14 ENCOUNTER — Other Ambulatory Visit: Payer: Self-pay

## 2021-10-15 ENCOUNTER — Other Ambulatory Visit: Payer: Self-pay

## 2021-10-26 ENCOUNTER — Other Ambulatory Visit: Payer: Self-pay

## 2021-10-29 ENCOUNTER — Other Ambulatory Visit (HOSPITAL_COMMUNITY): Payer: Self-pay

## 2021-10-29 ENCOUNTER — Other Ambulatory Visit: Payer: Self-pay

## 2021-10-29 MED ORDER — AREXVY 120 MCG/0.5ML IM SUSR
INTRAMUSCULAR | 0 refills | Status: DC
  Filled 2021-10-29 (×2): qty 0.5, 1d supply, fill #0

## 2021-10-30 ENCOUNTER — Other Ambulatory Visit: Payer: Self-pay

## 2021-11-12 ENCOUNTER — Other Ambulatory Visit: Payer: Self-pay

## 2021-11-17 ENCOUNTER — Other Ambulatory Visit: Payer: Self-pay

## 2021-11-25 ENCOUNTER — Other Ambulatory Visit: Payer: Self-pay

## 2021-12-01 ENCOUNTER — Other Ambulatory Visit: Payer: Self-pay

## 2021-12-01 DIAGNOSIS — J4530 Mild persistent asthma, uncomplicated: Secondary | ICD-10-CM | POA: Diagnosis not present

## 2021-12-01 DIAGNOSIS — J301 Allergic rhinitis due to pollen: Secondary | ICD-10-CM | POA: Diagnosis not present

## 2021-12-01 MED ORDER — BENZONATATE 100 MG PO CAPS
ORAL_CAPSULE | ORAL | 0 refills | Status: DC
  Filled 2021-12-01: qty 20, 7d supply, fill #0

## 2021-12-01 MED ORDER — IPRATROPIUM-ALBUTEROL 0.5-2.5 (3) MG/3ML IN SOLN
RESPIRATORY_TRACT | 0 refills | Status: DC
  Filled 2021-12-01: qty 270, 30d supply, fill #0

## 2021-12-01 MED ORDER — METHYLPREDNISOLONE 4 MG PO TBPK
ORAL_TABLET | ORAL | 0 refills | Status: DC
  Filled 2021-12-01: qty 21, 6d supply, fill #0

## 2021-12-09 ENCOUNTER — Other Ambulatory Visit: Payer: Self-pay

## 2021-12-09 MED ORDER — COVID-19 MRNA VAC-TRIS(PFIZER) 30 MCG/0.3ML IM SUSY
PREFILLED_SYRINGE | INTRAMUSCULAR | 0 refills | Status: DC
  Filled 2021-12-11: qty 0.3, 1d supply, fill #0

## 2021-12-11 ENCOUNTER — Other Ambulatory Visit: Payer: Self-pay

## 2021-12-22 ENCOUNTER — Other Ambulatory Visit: Payer: Self-pay

## 2022-01-14 ENCOUNTER — Other Ambulatory Visit: Payer: Self-pay

## 2022-01-14 DIAGNOSIS — E1169 Type 2 diabetes mellitus with other specified complication: Secondary | ICD-10-CM | POA: Diagnosis not present

## 2022-01-14 DIAGNOSIS — E785 Hyperlipidemia, unspecified: Secondary | ICD-10-CM | POA: Diagnosis not present

## 2022-01-14 MED ORDER — MOUNJARO 10 MG/0.5ML ~~LOC~~ SOAJ
SUBCUTANEOUS | 4 refills | Status: DC
  Filled 2022-01-14: qty 2, 28d supply, fill #0
  Filled 2022-02-12: qty 2, 28d supply, fill #1
  Filled 2022-03-01: qty 6, 84d supply, fill #2
  Filled 2022-05-31: qty 6, 84d supply, fill #3
  Filled 2022-08-19: qty 6, 84d supply, fill #4
  Filled 2022-11-18: qty 6, 84d supply, fill #5

## 2022-01-21 ENCOUNTER — Other Ambulatory Visit: Payer: Self-pay | Admitting: Family Medicine

## 2022-01-21 DIAGNOSIS — Z1231 Encounter for screening mammogram for malignant neoplasm of breast: Secondary | ICD-10-CM

## 2022-02-04 DIAGNOSIS — E119 Type 2 diabetes mellitus without complications: Secondary | ICD-10-CM | POA: Diagnosis not present

## 2022-02-04 DIAGNOSIS — H524 Presbyopia: Secondary | ICD-10-CM | POA: Diagnosis not present

## 2022-02-05 ENCOUNTER — Ambulatory Visit
Admission: RE | Admit: 2022-02-05 | Discharge: 2022-02-05 | Disposition: A | Discharge status: Home / Self Care | Payer: 59 | Source: Ambulatory Visit | Attending: Family Medicine | Admitting: Family Medicine

## 2022-02-05 DIAGNOSIS — Z1231 Encounter for screening mammogram for malignant neoplasm of breast: Secondary | ICD-10-CM | POA: Insufficient documentation

## 2022-02-11 ENCOUNTER — Other Ambulatory Visit: Payer: Self-pay | Admitting: Family Medicine

## 2022-02-11 ENCOUNTER — Other Ambulatory Visit: Payer: Self-pay

## 2022-02-11 ENCOUNTER — Telehealth: Payer: Self-pay

## 2022-02-11 DIAGNOSIS — R921 Mammographic calcification found on diagnostic imaging of breast: Secondary | ICD-10-CM

## 2022-02-11 DIAGNOSIS — Z86010 Personal history of colonic polyps: Secondary | ICD-10-CM

## 2022-02-11 DIAGNOSIS — R928 Other abnormal and inconclusive findings on diagnostic imaging of breast: Secondary | ICD-10-CM

## 2022-02-11 MED ORDER — NA SULFATE-K SULFATE-MG SULF 17.5-3.13-1.6 GM/177ML PO SOLN
1.0000 | Freq: Once | ORAL | 0 refills | Status: AC
  Filled 2022-02-11: qty 354, 1d supply, fill #0

## 2022-02-11 NOTE — Telephone Encounter (Signed)
Gastroenterology Pre-Procedure Review  Request Date: 03/19/22 Requesting Physician: Dr. Marius Ditch  PATIENT REVIEW QUESTIONS: The patient responded to the following health history questions as indicated:    1. Are you having any GI issues? no 2. 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 Bonna Gains) 3. Do you have a family history of Colon Cancer or Polyps? no 4. Diabetes Mellitus? yes (stop mounjaro 03/12/22, stop jardiance 03/16/22) 5. Joint replacements in the past 12 months?no 6. Major health problems in the past 3 months?no 7. Any artificial heart valves, MVP, or defibrillator?no    MEDICATIONS & ALLERGIES:    Patient reports the following regarding taking any anticoagulation/antiplatelet therapy:   Plavix, Coumadin, Eliquis, Xarelto, Lovenox, Pradaxa, Brilinta, or Effient? no Aspirin? no  Patient confirms/reports the following medications:  Current Outpatient Medications  Medication Sig Dispense Refill   ACCU-CHEK FASTCLIX LANCETS MISC   5   ACCU-CHEK GUIDE test strip   11   benzonatate (TESSALON) 100 MG capsule Take 1 capsule (100 mg total) by mouth 3 (three) times daily as needed for Cough for up to 7 days 20 capsule 0   budesonide-formoterol (SYMBICORT) 160-4.5 MCG/ACT inhaler Inhale 2 puffs into the lungs every morning.      budesonide-formoterol (SYMBICORT) 160-4.5 MCG/ACT inhaler INHALE 2 INHALATIONS INTO THE LUNGS 2 (TWO) TIMES DAILY 10.2 g 2   COVID-19 mRNA vaccine 2023-2024 (COMIRNATY) syringe Inject into the muscle. 0.3 mL 0   Dulaglutide (TRULICITY) 3 PX/1.0GY SOPN Inject 3 mg into the skin every Saturday.     empagliflozin (JARDIANCE) 25 MG TABS tablet Take by mouth daily.     empagliflozin (JARDIANCE) 25 MG TABS tablet TAKE 1 TABLET (25 MG TOTAL) BY MOUTH ONCE DAILY 90 tablet 4   EPINEPHrine 0.3 mg/0.3 mL IJ SOAJ injection Inject 0.3 mg as directed as needed for anaphylaxis.      EPINEPHrine 0.3 mg/0.3 mL IJ SOAJ injection Inject 0.3 mLs  (0.3 mg total) into the muscle once as needed for Anaphylaxis for up to 1 dose 2 each 1   ezetimibe (ZETIA) 10 MG tablet TAKE 1 TABLET (10 MG TOTAL) BY MOUTH ONCE DAILY 30 tablet 3   glimepiride (AMARYL) 4 MG tablet Take 2 mg by mouth 2 (two) times daily.      glimepiride (AMARYL) 4 MG tablet TAKE 1/2 TABLETS (2 MG) BY MOUTH 2 TIMES DAILY 90 tablet 4   HYDROcodone-acetaminophen (NORCO) 5-325 MG tablet Take 1-2 tablets by mouth every 4 (four) hours as needed for moderate pain. (Patient not taking: Reported on 08/01/2019) 12 tablet 0   ibuprofen (ADVIL,MOTRIN) 200 MG tablet Take 600 mg by mouth every 6 (six) hours as needed for mild pain or moderate pain. (Patient not taking: Reported on 08/01/2019)     ipratropium-albuterol (DUONEB) 0.5-2.5 (3) MG/3ML SOLN INHALE 1 VIAL (3 MLS) BY NEBULIZATION 4 TIMES DAILY AS NEEDED FOR WHEEZING 360 mL 0   ipratropium-albuterol (DUONEB) 0.5-2.5 (3) MG/3ML SOLN Inhale 1 vial (3 mLs) by nebulization 3 (three) times daily as needed for Wheezing 360 mL 0   levalbuterol (XOPENEX) 1.25 MG/0.5ML nebulizer solution Take 1.25 mg by nebulization every 4 (four) hours as needed for wheezing or shortness of breath.     loratadine (CLARITIN) 10 MG tablet Take 10 mg by mouth at bedtime.     losartan (COZAAR) 25 MG tablet Take 25 mg by mouth every morning.      losartan (COZAAR) 25 MG tablet Take 1 tablet (25 mg total) by  mouth once daily 90 tablet 1   meloxicam (MOBIC) 15 MG tablet Take 1 tablet (15 mg total) by mouth daily with breakfast 21 tablet 0   methylPREDNISolone (MEDROL DOSEPAK) 4 MG TBPK tablet Follow package directions. 21 tablet 0   montelukast (SINGULAIR) 10 MG tablet Take 10 mg by mouth at bedtime. (Patient not taking: Reported on 08/01/2019)     montelukast (SINGULAIR) 10 MG tablet Take 1 tablet (10 mg total) by mouth at bedtime 90 tablet 3   Omega-3 Fatty Acids (FISH OIL CONCENTRATE) 300 MG CAPS Take 1 tablet by mouth 3 (three) times a week.  (Patient not taking:  Reported on 08/01/2019)     ondansetron (ZOFRAN-ODT) 4 MG disintegrating tablet Take 1 tablet (4 mg total) by mouth every 8 (eight) hours as needed for nausea or vomiting. 12 tablet 0   pravastatin (PRAVACHOL) 40 MG tablet Take 40 mg by mouth at bedtime.      prochlorperazine (COMPAZINE) 10 MG tablet Take 10 mg by mouth every 6 (six) hours as needed for nausea or vomiting (with migraines). (Patient not taking: Reported on 08/01/2019)     Rimegepant Sulfate (NURTEC) 75 MG TBDP take 1 tablet by mouth daily as needed for breakthrough migraine 8 tablet 0   Rimegepant Sulfate (NURTEC) 75 MG TBDP Take 1 tablet by mouth daily as needed for breakthrough migraine 8 tablet 0   Rimegepant Sulfate (NURTEC) 75 MG TBDP Take 1 tablet by mouth daily as needed for breakthrough migraine 8 tablet 0   rosuvastatin (CRESTOR) 5 MG tablet TAKE 1 TABLET BY MOUTH ONCE DAILY 30 tablet 11   rosuvastatin (CRESTOR) 5 MG tablet Take 1 tablet (5 mg total) by mouth once daily 90 tablet 1   RSV vaccine recomb adjuvanted (AREXVY) 120 MCG/0.5ML injection Inject 0.5 mLs (120 mcg total) into the muscle once for 1 dose 0.5 mL 0   SUMAtriptan (IMITREX) 100 MG tablet Take 100 mg by mouth every 2 (two) hours as needed for migraine. Reported on 08/25/2015 (Patient not taking: Reported on 08/01/2019)     tirzepatide (MOUNJARO) 10 MG/0.5ML Pen Inject 10 mg subcutaneously every 7 (seven) days 2 mL 6   tirzepatide (MOUNJARO) 10 MG/0.5ML Pen Inject 10 mg subcutaneously every 7 (seven) days 2 mL 12   tirzepatide (MOUNJARO) 10 MG/0.5ML Pen Inject 10 mg subcutaneously every 7 (seven) days 6 mL 4   tirzepatide (MOUNJARO) 12.5 MG/0.5ML Pen Inject 12.5 mg subcutaneously every 7 (seven) days 2 mL 6   tirzepatide (MOUNJARO) 15 MG/0.5ML Pen Inject 15 mg subcutaneously every 7 (seven) days 2 mL 12   tiZANidine (ZANAFLEX) 2 MG tablet Take 1 tablet (2 mg total) by mouth 3 (three) times daily as needed 30 tablet 0   triamcinolone cream (KENALOG) 0.5 % Apply  topically 2 (two) times daily Apply 2 x per day (up to 7-10 days) 30 g 0   No current facility-administered medications for this visit.    Patient confirms/reports the following allergies:  Allergies  Allergen Reactions   Other Anaphylaxis    Lobster- anaphylaxis. Per pt, she can eat other shellfish   Penicillins Anaphylaxis    Has taken Amoxicillin, Keflex, cefazolin without issue. Has patient had a PCN reaction causing immediate rash, facial/tongue/throat swelling, SOB or lightheadedness with hypotension: Yes Has patient had a PCN reaction causing severe rash involving mucus membranes or skin necrosis: No Has patient had a PCN reaction that required hospitalization Yes Has patient had a PCN reaction occurring within the last 10 years:  No If all of the above answers are "NO", then may proceed with Cephalosporin use.     Saxagliptin Anaphylaxis and Other (See Comments)    Caused facial swelling & "droopy" lip    Vancomycin Anaphylaxis   Metformin And Related Nausea And Vomiting    "intolerance"   Propranolol     Other reaction(s): Other (See Comments) Lowers BP   Sulfamethoxazole-Trimethoprim Nausea Only   [place] Purpureus Went Yeast Rash   Red Yeast Rice  [Cholestin] Rash    No orders of the defined types were placed in this encounter.   AUTHORIZATION INFORMATION Primary Insurance: 1D#: Group #:  Secondary Insurance: 1D#: Group #:  SCHEDULE INFORMATION: Date: 03/19/22 Time: Location: [facility]

## 2022-02-17 ENCOUNTER — Ambulatory Visit
Admission: RE | Admit: 2022-02-17 | Discharge: 2022-02-17 | Disposition: A | Discharge status: Home / Self Care | Payer: Commercial Managed Care - PPO | Source: Ambulatory Visit | Attending: Family Medicine | Admitting: Family Medicine

## 2022-02-17 DIAGNOSIS — R928 Other abnormal and inconclusive findings on diagnostic imaging of breast: Secondary | ICD-10-CM | POA: Insufficient documentation

## 2022-02-17 DIAGNOSIS — R921 Mammographic calcification found on diagnostic imaging of breast: Secondary | ICD-10-CM | POA: Diagnosis not present

## 2022-02-22 ENCOUNTER — Other Ambulatory Visit: Payer: Self-pay | Admitting: Family Medicine

## 2022-02-22 DIAGNOSIS — R921 Mammographic calcification found on diagnostic imaging of breast: Secondary | ICD-10-CM

## 2022-02-22 DIAGNOSIS — R928 Other abnormal and inconclusive findings on diagnostic imaging of breast: Secondary | ICD-10-CM

## 2022-02-24 ENCOUNTER — Ambulatory Visit
Admission: RE | Admit: 2022-02-24 | Discharge: 2022-02-24 | Disposition: A | Discharge status: Home / Self Care | Payer: Commercial Managed Care - PPO | Source: Ambulatory Visit | Attending: Family Medicine | Admitting: Family Medicine

## 2022-02-24 DIAGNOSIS — R921 Mammographic calcification found on diagnostic imaging of breast: Secondary | ICD-10-CM | POA: Insufficient documentation

## 2022-02-24 DIAGNOSIS — R928 Other abnormal and inconclusive findings on diagnostic imaging of breast: Secondary | ICD-10-CM | POA: Insufficient documentation

## 2022-02-24 HISTORY — PX: BREAST BIOPSY: SHX20

## 2022-02-24 MED ORDER — LIDOCAINE HCL (PF) 1 % IJ SOLN
5.0000 mL | Freq: Once | INTRAMUSCULAR | Status: AC
  Administered 2022-02-24: 5 mL

## 2022-02-24 MED ORDER — LIDOCAINE-EPINEPHRINE (PF) 1 %-1:200000 IJ SOLN
20.0000 mL | Freq: Once | INTRAMUSCULAR | Status: AC
  Administered 2022-02-24: 20 mL

## 2022-02-25 DIAGNOSIS — E1169 Type 2 diabetes mellitus with other specified complication: Secondary | ICD-10-CM | POA: Diagnosis not present

## 2022-02-25 DIAGNOSIS — E785 Hyperlipidemia, unspecified: Secondary | ICD-10-CM | POA: Diagnosis not present

## 2022-02-25 LAB — SURGICAL PATHOLOGY

## 2022-03-01 ENCOUNTER — Other Ambulatory Visit: Payer: Self-pay

## 2022-03-04 ENCOUNTER — Other Ambulatory Visit: Payer: Self-pay

## 2022-03-04 DIAGNOSIS — G43009 Migraine without aura, not intractable, without status migrainosus: Secondary | ICD-10-CM | POA: Diagnosis not present

## 2022-03-04 DIAGNOSIS — E785 Hyperlipidemia, unspecified: Secondary | ICD-10-CM | POA: Diagnosis not present

## 2022-03-04 DIAGNOSIS — E6609 Other obesity due to excess calories: Secondary | ICD-10-CM | POA: Diagnosis not present

## 2022-03-04 DIAGNOSIS — E1169 Type 2 diabetes mellitus with other specified complication: Secondary | ICD-10-CM | POA: Diagnosis not present

## 2022-03-04 DIAGNOSIS — Z6834 Body mass index (BMI) 34.0-34.9, adult: Secondary | ICD-10-CM | POA: Diagnosis not present

## 2022-03-04 MED ORDER — ROSUVASTATIN CALCIUM 5 MG PO TABS
5.0000 mg | ORAL_TABLET | Freq: Every day | ORAL | 1 refills | Status: DC
  Filled 2022-03-04: qty 90, 90d supply, fill #0
  Filled 2022-07-07: qty 90, 90d supply, fill #1

## 2022-03-04 MED ORDER — EPINEPHRINE 0.3 MG/0.3ML IJ SOAJ
0.3000 mg | INTRAMUSCULAR | 1 refills | Status: DC | PRN
  Filled 2022-03-04: qty 2, 30d supply, fill #0

## 2022-03-05 ENCOUNTER — Other Ambulatory Visit: Payer: Self-pay

## 2022-03-08 ENCOUNTER — Other Ambulatory Visit: Payer: Self-pay

## 2022-03-09 ENCOUNTER — Other Ambulatory Visit (HOSPITAL_COMMUNITY): Payer: Self-pay

## 2022-03-11 ENCOUNTER — Other Ambulatory Visit (HOSPITAL_COMMUNITY): Payer: Self-pay

## 2022-03-18 ENCOUNTER — Encounter: Payer: Self-pay | Admitting: Gastroenterology

## 2022-03-19 ENCOUNTER — Ambulatory Visit: Payer: Commercial Managed Care - PPO | Admitting: Anesthesiology

## 2022-03-19 ENCOUNTER — Encounter
Admission: RE | Disposition: A | Discharge status: Home / Self Care | Payer: Self-pay | Source: Home / Self Care | Attending: Gastroenterology

## 2022-03-19 ENCOUNTER — Ambulatory Visit
Admission: RE | Admit: 2022-03-19 | Discharge: 2022-03-19 | Disposition: A | Discharge status: Home / Self Care | Payer: Commercial Managed Care - PPO | Attending: Gastroenterology | Admitting: Gastroenterology

## 2022-03-19 DIAGNOSIS — Z1211 Encounter for screening for malignant neoplasm of colon: Secondary | ICD-10-CM | POA: Insufficient documentation

## 2022-03-19 DIAGNOSIS — K635 Polyp of colon: Secondary | ICD-10-CM

## 2022-03-19 DIAGNOSIS — J45909 Unspecified asthma, uncomplicated: Secondary | ICD-10-CM | POA: Insufficient documentation

## 2022-03-19 DIAGNOSIS — Z800 Family history of malignant neoplasm of digestive organs: Secondary | ICD-10-CM | POA: Insufficient documentation

## 2022-03-19 DIAGNOSIS — E7800 Pure hypercholesterolemia, unspecified: Secondary | ICD-10-CM | POA: Insufficient documentation

## 2022-03-19 DIAGNOSIS — Z833 Family history of diabetes mellitus: Secondary | ICD-10-CM | POA: Diagnosis not present

## 2022-03-19 DIAGNOSIS — Z6832 Body mass index (BMI) 32.0-32.9, adult: Secondary | ICD-10-CM | POA: Insufficient documentation

## 2022-03-19 DIAGNOSIS — Z7984 Long term (current) use of oral hypoglycemic drugs: Secondary | ICD-10-CM | POA: Insufficient documentation

## 2022-03-19 DIAGNOSIS — Z90710 Acquired absence of both cervix and uterus: Secondary | ICD-10-CM | POA: Insufficient documentation

## 2022-03-19 DIAGNOSIS — K219 Gastro-esophageal reflux disease without esophagitis: Secondary | ICD-10-CM | POA: Diagnosis not present

## 2022-03-19 DIAGNOSIS — D125 Benign neoplasm of sigmoid colon: Secondary | ICD-10-CM | POA: Diagnosis not present

## 2022-03-19 DIAGNOSIS — Z803 Family history of malignant neoplasm of breast: Secondary | ICD-10-CM | POA: Insufficient documentation

## 2022-03-19 DIAGNOSIS — E119 Type 2 diabetes mellitus without complications: Secondary | ICD-10-CM | POA: Insufficient documentation

## 2022-03-19 DIAGNOSIS — E669 Obesity, unspecified: Secondary | ICD-10-CM | POA: Diagnosis not present

## 2022-03-19 DIAGNOSIS — Z86010 Personal history of colonic polyps: Secondary | ICD-10-CM

## 2022-03-19 DIAGNOSIS — D120 Benign neoplasm of cecum: Secondary | ICD-10-CM | POA: Diagnosis not present

## 2022-03-19 HISTORY — PX: COLONOSCOPY WITH PROPOFOL: SHX5780

## 2022-03-19 LAB — GLUCOSE, CAPILLARY: Glucose-Capillary: 104 mg/dL — ABNORMAL HIGH (ref 70–99)

## 2022-03-19 SURGERY — COLONOSCOPY WITH PROPOFOL
Anesthesia: General

## 2022-03-19 MED ORDER — SODIUM CHLORIDE 0.9 % IV SOLN: INTRAVENOUS | Status: DC

## 2022-03-19 MED ORDER — DEXMEDETOMIDINE HCL IN NACL 80 MCG/20ML IV SOLN
INTRAVENOUS | Status: DC | PRN
  Administered 2022-03-19: 8 ug via INTRAVENOUS

## 2022-03-19 MED ORDER — PROPOFOL 10 MG/ML IV BOLUS
INTRAVENOUS | Status: DC | PRN
  Administered 2022-03-19: 70 mg via INTRAVENOUS

## 2022-03-19 MED ORDER — PROPOFOL 500 MG/50ML IV EMUL
INTRAVENOUS | Status: DC | PRN
  Administered 2022-03-19: 175 ug/kg/min via INTRAVENOUS

## 2022-03-19 NOTE — Anesthesia Preprocedure Evaluation (Signed)
Anesthesia Evaluation  Patient identified by MRN, date of birth, ID band Patient awake    Reviewed: Allergy & Precautions, NPO status , Patient's Chart, lab work & pertinent test results  Airway Mallampati: III  TM Distance: >3 FB Neck ROM: full    Dental  (+) Teeth Intact   Pulmonary neg pulmonary ROS, asthma    Pulmonary exam normal breath sounds clear to auscultation       Cardiovascular Exercise Tolerance: Good negative cardio ROS Normal cardiovascular exam Rhythm:Regular     Neuro/Psych  Headaches negative neurological ROS  negative psych ROS   GI/Hepatic negative GI ROS, Neg liver ROS,GERD  Medicated,,  Endo/Other  negative endocrine ROSdiabetes, Type 2, Oral Hypoglycemic Agents    Renal/GU negative Renal ROS  negative genitourinary   Musculoskeletal negative musculoskeletal ROS (+)    Abdominal  (+) + obese  Peds negative pediatric ROS (+)  Hematology negative hematology ROS (+)   Anesthesia Other Findings Past Medical History: No date: Allergy No date: Asthma     Comment:  triggered by odors-well controlled No date: Complication of anesthesia     Comment:  PT STATES SHE BLED ALOT AFTER HER WISDOM TEETH WERE               REMOVED-HAD TO GO BACK AND RECEIVE A BLOOD               TRANSFUSION-W/U DONE WITH HEMATOLOGIST AND NOTHING WAS               FOUND-HAS SINCE HAD OTHER SURGERIES AND HAS HAD NO ISSUES              WITH BLEEDING No date: Diabetes mellitus without complication ([place]) No date: GERD (gastroesophageal reflux disease) 1976: H/O scarlet fever No date: Headache     Comment:  migraines, triggered by weather changes No date: Heart murmur     Comment:  from rheumatic fever 07/28/2015: Pure hypercholesterolemia  Past Surgical History: 2009: ABDOMINAL HYSTERECTOMY 1996: AUGMENTATION MAMMAPLASTY; Bilateral 09/15/2015: BREAST BIOPSY; Left     Comment:  hyalinized fibroadenoma, fibrocystic  changes 07/16/2019: BREAST BIOPSY; Right     Comment:  ribbon clip, stereo bx, neg 02/24/2022: BREAST BIOPSY; Right     Comment:  coil clip, stereo bx, calcs, path pending. 02/24/2022: BREAST BIOPSY; Right     Comment:  MM RT BREAST BX W LOC DEV 1ST LESION IMAGE BX SPEC               STEREO GUIDE 02/24/2022 [facility] 08/17/2019: BREAST BIOPSY WITH RADIO FREQUENCY LOCALIZER; Right     Comment:  Procedure: BREAST BIOPSY WITH RADIO FREQUENCY LOCALIZER;              Surgeon: Herbert Pun, MD;  Location: [facility];               Service: General;  Laterality: Right; 2003: BREAST CYST ASPIRATION; Right 1996: BREAST ENHANCEMENT SURGERY; Bilateral 1997: BREAST EXCISIONAL BIOPSY; Right     Comment:  neg 04/15/2016: BREAST EXCISIONAL BIOPSY; Left     Comment:  Needle Placement 04/15/2016: BREAST LUMPECTOMY WITH NEEDLE LOCALIZATION; Left     Comment:  Procedure: BREAST LUMPECTOMY WITH NEEDLE LOCALIZATION;                Surgeon: Leonie Green, MD;  Location: [facility];                Service: General;  Laterality: Left; 1983: CESAREAN SECTION 03/15/2017: COLONOSCOPY WITH PROPOFOL; N/A  Comment:  Procedure: COLONOSCOPY WITH PROPOFOL;  Surgeon:               Virgel Manifold, MD;  Location: [facility];                Service: Endoscopy;  Laterality: N/A; 2007: dislocated right shoulder; Right     Comment:  block to put shoulder back in place 2009: OVARIAN CYST REMOVAL 2006,2007: [place]; Bilateral 2008: TONSILLECTOMY AND ADENOIDECTOMY     Comment:  UUUP 11/2018: tummy tuck No date: WISDOM TOOTH EXTRACTION  BMI    Body Mass Index: 32.45 kg/m      Reproductive/Obstetrics negative OB ROS                             Anesthesia Physical Anesthesia Plan  ASA: 2  Anesthesia Plan: General   Post-op Pain Management:    Induction: Intravenous  PONV Risk Score and Plan: Propofol infusion and TIVA  Airway Management  Planned: Natural Airway  Additional Equipment:   Intra-op Plan:   Post-operative Plan:   Informed Consent: I have reviewed the patients History and Physical, chart, labs and discussed the procedure including the risks, benefits and alternatives for the proposed anesthesia with the patient or authorized representative who has indicated his/her understanding and acceptance.     Dental Advisory Given  Plan Discussed with: CRNA and Surgeon  Anesthesia Plan Comments:        Anesthesia Quick Evaluation

## 2022-03-19 NOTE — Anesthesia Postprocedure Evaluation (Signed)
Anesthesia Post Note  Patient: Virginia Rogers  Procedure(s) Performed: COLONOSCOPY WITH PROPOFOL  Patient location during evaluation: PACU Anesthesia Type: General Level of consciousness: awake and awake and alert Pain management: pain level controlled Vital Signs Assessment: post-procedure vital signs reviewed and stable Respiratory status: spontaneous breathing and nonlabored ventilation Cardiovascular status: stable Anesthetic complications: no  No notable events documented.   Last Vitals:  Vitals:   03/19/22 1058 03/19/22 1108  BP: (!) 98/52 108/72  Pulse: 81 76  Resp: 19 20  Temp:    SpO2: 100% 100%    Last Pain:  Vitals:   03/19/22 1108  TempSrc:   PainSc: 0-No pain                 VAN STAVEREN,Talena Neira

## 2022-03-19 NOTE — Transfer of Care (Signed)
Immediate Anesthesia Transfer of Care Note  Patient: Virginia Rogers  Procedure(s) Performed: COLONOSCOPY WITH PROPOFOL  Patient Location: PACU  Anesthesia Type:General  Level of Consciousness: awake, alert , and oriented  Airway & Oxygen Therapy: Patient Spontanous Breathing  Post-op Assessment: Report given to RN and Post -op Vital signs reviewed and stable  Post vital signs: Reviewed and stable  Last Vitals:  Vitals Value Taken Time  BP    Temp    Pulse    Resp    SpO2      Last Pain:  Vitals:   03/19/22 1048  TempSrc: Temporal  PainSc: Asleep         Complications: No notable events documented.

## 2022-03-19 NOTE — Op Note (Signed)
[facility] Gastroenterology Patient Name: Virginia Rogers Procedure Date: 03/19/2022 10:20 AM MRN: [id number] Account #: [id number] Date of Birth: May 11, 1960 Admit Type: Outpatient Age: 62 Room: [facility] ENDO ROOM 2 Gender: Female Note Status: Finalized Instrument Name: Colonoscope [id number] Procedure:             Colonoscopy Indications:           Screening for colorectal malignant neoplasm, Last                         colonoscopy: February 2019 Providers:             Lin Landsman MD, MD Referring MD:          Dion Body (Referring MD) Medicines:             General Anesthesia Complications:         No immediate complications. Estimated blood loss: None. Procedure:             Pre-Anesthesia Assessment:                        - Prior to the procedure, a History and Physical was                         performed, and patient medications and allergies were                         reviewed. The patient is competent. The risks and                         benefits of the procedure and the sedation options and                         risks were discussed with the patient. All questions                         were answered and informed consent was obtained.                         Patient identification and proposed procedure were                         verified by the physician, the nurse, the                         anesthesiologist, the anesthetist and the technician                         in the pre-procedure area in the procedure room in the                         endoscopy suite. Mental Status Examination: alert and                         oriented. Airway Examination: normal oropharyngeal                         airway and neck mobility. Respiratory Examination:  clear to auscultation. CV Examination: normal.                         Prophylactic Antibiotics: The patient does not require                         prophylactic  antibiotics. Prior Anticoagulants: The                         patient has taken no anticoagulant or antiplatelet                         agents. ASA Grade Assessment: II - A patient with mild                         systemic disease. After reviewing the risks and                         benefits, the patient was deemed in satisfactory                         condition to undergo the procedure. The anesthesia                         plan was to use general anesthesia. Immediately prior                         to administration of medications, the patient was                         re-assessed for adequacy to receive sedatives. The                         heart rate, respiratory rate, oxygen saturations,                         blood pressure, adequacy of pulmonary ventilation, and                         response to care were monitored throughout the                         procedure. The physical status of the patient was                         re-assessed after the procedure.                        After obtaining informed consent, the colonoscope was                         passed under direct vision. Throughout the procedure,                         the patient's blood pressure, pulse, and oxygen                         saturations were monitored continuously. The  Colonoscope was introduced through the anus and                         advanced to the the cecum, identified by appendiceal                         orifice and ileocecal valve. The colonoscopy was                         performed without difficulty. The patient tolerated                         the procedure well. The quality of the bowel                         preparation was evaluated using the BBPS [facility] Bowel                         Preparation Scale) with scores of: Right Colon = 3,                         Transverse Colon = 3 and Left Colon = 3 (entire mucosa                         seen  well with no residual staining, small fragments                         of stool or opaque liquid). The total BBPS score                         equals 9. The ileocecal valve, appendiceal orifice,                         and rectum were photographed. Findings:      The perianal and digital rectal examinations were normal. Pertinent       negatives include normal sphincter tone and no palpable rectal lesions.      A diminutive polyp was found in the cecum. The polyp was sessile. The       polyp was removed with a jumbo cold forceps. Resection and retrieval       were complete. Estimated blood loss: none.      The retroflexed view of the distal rectum and anal verge was normal and       showed no anal or rectal abnormalities.      The exam was otherwise without abnormality. Impression:            - One diminutive polyp in the cecum, removed with a                         jumbo cold forceps. Resected and retrieved.                        - The distal rectum and anal verge are normal on                         retroflexion view.                        -  The examination was otherwise normal. Recommendation:        - Discharge patient to home (with escort).                        - Resume previous diet today.                        - Continue present medications.                        - Await pathology results.                        - Repeat colonoscopy in 7-10 years for surveillance                         based on pathology results. Procedure Code(s):     --- Professional ---                        [phone], Colonoscopy, flexible; with biopsy, single or                         multiple Diagnosis Code(s):     --- Professional ---                        Z12.11, Encounter for screening for malignant neoplasm                         of colon                        D12.0, Benign neoplasm of cecum CPT copyright 2022 American Medical Association. All rights reserved. The codes documented in this  report are preliminary and upon coder review may  be revised to meet current compliance requirements. Dr. Ulyess Mort Lin Landsman MD, MD 03/19/2022 10:46:06 AM This report has been signed electronically. Number of Addenda: 0 Note Initiated On: 03/19/2022 10:20 AM Scope Withdrawal Time: 0 hours 9 minutes 54 seconds  Estimated Blood Loss:  Estimated blood loss: none.      [facility]

## 2022-03-19 NOTE — H&P (Addendum)
Virginia Darby, MD [address]  [place]  [place],  [address]  Main: [phone]  Fax: [phone] Pager: [phone]  Primary Care Physician:  Dion Body, MD Primary Gastroenterologist:  Dr. Cephas Rogers  Pre-Procedure History & Physical: HPI:  MILLIANI JORY is a 62 y.o. female is here for an colonoscopy.   Past Medical History:  Diagnosis Date   Allergy    Asthma    triggered by odors-well controlled   Complication of anesthesia    PT [place] TO GO BACK AND RECEIVE A BLOOD TRANSFUSION-W/U DONE WITH HEMATOLOGIST AND NOTHING WAS FOUND-HAS SINCE HAD OTHER SURGERIES AND HAS HAD NO ISSUES WITH BLEEDING   Diabetes mellitus without complication (HCC)    GERD (gastroesophageal reflux disease)    H/O scarlet fever 1976   Headache    migraines, triggered by weather changes   Heart murmur    from rheumatic fever   Pure hypercholesterolemia 07/28/2015    Past Surgical History:  Procedure Laterality Date   ABDOMINAL HYSTERECTOMY  2009   AUGMENTATION MAMMAPLASTY Bilateral 1996   BREAST BIOPSY Left 09/15/2015   hyalinized fibroadenoma, fibrocystic changes   BREAST BIOPSY Right 07/16/2019   ribbon clip, stereo bx, neg   BREAST BIOPSY Right 02/24/2022   coil clip, stereo bx, calcs, path pending.   BREAST BIOPSY Right 02/24/2022   MM RT BREAST BX W LOC DEV 1ST LESION IMAGE BX SPEC STEREO GUIDE 02/24/2022 [facility]   BREAST BIOPSY WITH RADIO FREQUENCY LOCALIZER Right 08/17/2019   Procedure: BREAST BIOPSY WITH RADIO FREQUENCY LOCALIZER;  Surgeon: Herbert Pun, MD;  Location: [facility] ORS;  Service: General;  Laterality: Right;   BREAST CYST ASPIRATION Right 2003   BREAST ENHANCEMENT SURGERY Bilateral 1996   BREAST EXCISIONAL BIOPSY Right 1997   neg   BREAST EXCISIONAL BIOPSY Left 04/15/2016   Needle Placement   BREAST LUMPECTOMY WITH NEEDLE LOCALIZATION Left 04/15/2016   Procedure: BREAST  LUMPECTOMY WITH NEEDLE LOCALIZATION;  Surgeon: Leonie Green, MD;  Location: [facility] ORS;  Service: General;  Laterality: Left;   CESAREAN SECTION  1983   COLONOSCOPY WITH PROPOFOL N/A 03/15/2017   Procedure: COLONOSCOPY WITH PROPOFOL;  Surgeon: Virgel Manifold, MD;  Location: [facility] ENDOSCOPY;  Service: Endoscopy;  Laterality: N/A;   dislocated right shoulder Right 2007   block to put shoulder back in place   OVARIAN CYST REMOVAL  2009   ROTATOR CUFF REPAIR Bilateral 2006,2007   TONSILLECTOMY AND ADENOIDECTOMY  2008   UUUP   tummy tuck  11/2018   WISDOM TOOTH EXTRACTION      Prior to Admission medications   Medication Sig Start Date End Date Taking? Authorizing Provider  empagliflozin (JARDIANCE) 25 MG TABS tablet Take by mouth daily.   Yes [provider]  ACCU-CHEK FASTCLIX LANCETS [place]  03/29/16   [provider]  ACCU-CHEK GUIDE test strip  05/27/16   [provider]  benzonatate (TESSALON) 100 MG capsule Take 1 capsule (100 mg total) by mouth 3 (three) times daily as needed for Cough for up to 7 days 12/01/21     budesonide-formoterol (SYMBICORT) 160-4.5 MCG/ACT inhaler Inhale 2 puffs into the lungs every morning.  03/09/16   [provider]  budesonide-formoterol (SYMBICORT) 160-4.5 MCG/ACT inhaler INHALE 2 INHALATIONS INTO THE LUNGS 2 (TWO) TIMES DAILY 02/21/20 02/20/21  Erby Pian, MD  COVID-19 mRNA vaccine [phone] (COMIRNATY) syringe Inject into the muscle. 12/09/21   Carlyle Basques, MD  empagliflozin (JARDIANCE) 25 MG TABS tablet TAKE 1 TABLET (25 MG TOTAL) BY MOUTH ONCE DAILY 09/11/21     EPINEPHrine 0.3 mg/0.3 mL IJ SOAJ injection Inject 0.3 mg as directed as needed for anaphylaxis.     [provider]  EPINEPHrine 0.3 mg/0.3 mL IJ SOAJ injection Inject 0.3 mLs (0.3 mg total) into the muscle once as needed for Anaphylaxis for up to 1 dose 08/27/21     EPINEPHrine 0.3 mg/0.3 mL IJ SOAJ injection Inject 0.3 mg into the muscle  once as needed for up to 1 dose. 03/04/22     ezetimibe (ZETIA) 10 MG tablet TAKE 1 TABLET (10 MG TOTAL) BY MOUTH ONCE DAILY 04/17/20 04/17/21  Dion Body, MD  glimepiride (AMARYL) 4 MG tablet TAKE 1/2 TABLETS (2 MG) BY MOUTH 2 TIMES DAILY 06/07/19 06/06/20  Lonia Farber, MD  ibuprofen (ADVIL,MOTRIN) 200 MG tablet Take 600 mg by mouth every 6 (six) hours as needed for mild pain or moderate pain. Patient not taking: Reported on 08/01/2019    [provider]  ipratropium-albuterol (DUONEB) 0.5-2.5 (3) MG/3ML SOLN INHALE 1 VIAL (3 MLS) BY NEBULIZATION 4 TIMES DAILY AS NEEDED FOR WHEEZING 04/22/20 04/22/21  Erby Pian, MD  ipratropium-albuterol (DUONEB) 0.5-2.5 (3) MG/3ML SOLN Inhale 1 vial (3 mLs) by nebulization 3 (three) times daily as needed for Wheezing 12/01/21     levalbuterol (XOPENEX) 1.25 MG/0.5ML nebulizer solution Take 1.25 mg by nebulization every 4 (four) hours as needed for wheezing or shortness of breath.    [provider]  loratadine (CLARITIN) 10 MG tablet Take 10 mg by mouth at bedtime.    [provider]  losartan (COZAAR) 25 MG tablet Take 25 mg by mouth every morning.     [provider]  losartan (COZAAR) 25 MG tablet Take 1 tablet (25 mg total) by mouth once daily 02/25/21     methylPREDNISolone (MEDROL DOSEPAK) 4 MG TBPK tablet Follow package directions. 12/01/21     montelukast (SINGULAIR) 10 MG tablet Take 10 mg by mouth at bedtime. Patient not taking: Reported on 08/01/2019    [provider]  montelukast (SINGULAIR) 10 MG tablet Take 1 tablet (10 mg total) by mouth at bedtime Patient not taking: Reported on 03/19/2022 10/30/20     Omega-3 Fatty Acids (FISH OIL CONCENTRATE) 300 MG CAPS Take 1 tablet by mouth 3 (three) times a week.  Patient not taking: Reported on 08/01/2019    [provider]  ondansetron (ZOFRAN-ODT) 4 MG disintegrating tablet Take 1 tablet (4 mg total) by mouth every 8 (eight) hours as needed  for nausea or vomiting. 06/29/21   Blake Divine, MD  pravastatin (PRAVACHOL) 40 MG tablet Take 40 mg by mouth at bedtime.  Patient not taking: Reported on 03/19/2022    [provider]  prochlorperazine (COMPAZINE) 10 MG tablet Take 10 mg by mouth every 6 (six) hours as needed for nausea or vomiting (with migraines). Patient not taking: Reported on 08/01/2019    [provider]  Rimegepant Sulfate (NURTEC) 75 MG TBDP take 1 tablet by mouth daily as needed for breakthrough migraine 06/11/20     Rimegepant Sulfate (NURTEC) 75 MG TBDP Take 1 tablet by mouth daily as needed for breakthrough migraine 04/21/21     Rimegepant Sulfate (NURTEC) 75 MG TBDP Take 1 tablet by mouth daily as needed for breakthrough migraine 08/27/21     rosuvastatin (CRESTOR) 5 MG tablet TAKE 1 TABLET BY MOUTH ONCE DAILY 04/23/20 04/23/21  Honor Junes,  Arvid Right, MD  rosuvastatin (CRESTOR) 5 MG tablet Take 1 tablet (5 mg total) by mouth once daily 02/25/21     rosuvastatin (CRESTOR) 5 MG tablet Take 1 tablet (5 mg total) by mouth daily. 03/04/22     RSV vaccine recomb adjuvanted (AREXVY) 120 MCG/0.5ML injection Inject 0.5 mLs (120 mcg total) into the muscle once for 1 dose 10/29/21     SUMAtriptan (IMITREX) 100 MG tablet Take 100 mg by mouth every 2 (two) hours as needed for migraine. Reported on 08/25/2015 Patient not taking: Reported on 08/01/2019    [provider]  tirzepatide [facility]) 10 MG/0.5ML Pen Inject 10 mg subcutaneously every 7 (seven) days 08/28/20     tirzepatide [facility]) 10 MG/0.5ML Pen Inject 10 mg subcutaneously every 7 (seven) days 07/01/21     tirzepatide (MOUNJARO) 10 MG/0.5ML Pen Inject 10 mg subcutaneously every 7 (seven) days 01/14/22     tirzepatide (MOUNJARO) 12.5 MG/0.5ML Pen Inject 12.5 mg subcutaneously every 7 (seven) days 10/22/20     tirzepatide (MOUNJARO) 15 MG/0.5ML Pen Inject 15 mg subcutaneously every 7 (seven) days 11/19/20     tiZANidine (ZANAFLEX) 2 MG tablet Take 1 tablet (2  mg total) by mouth 3 (three) times daily as needed 06/27/20     triamcinolone cream (KENALOG) 0.5 % Apply topically 2 (two) times daily Apply 2 x per day (up to 7-10 days) 08/19/20       Allergies as of 02/11/2022 - Review Complete 06/29/2021  Allergen Reaction Noted   Other Anaphylaxis 05/12/2015   Penicillins Anaphylaxis 06/03/2014   Saxagliptin Anaphylaxis and Other (See Comments) 02/24/2017   Vancomycin Anaphylaxis 03/07/2015   Metformin and related Nausea And Vomiting 06/03/2014   Propranolol  03/07/2015   Sulfamethoxazole-trimethoprim Nausea Only 07/28/2015   [place]ascus purpureus went yeast Rash 09/19/2015   Red yeast rice  [cholestin] Rash 09/19/2015    Family History  Problem Relation Age of Onset   Arthritis Mother    Diabetes Mother    Heart disease Father    Heart disease Brother    Breast cancer Sister 29   Cancer Maternal Uncle    Colon cancer Maternal Grandmother 82    Social History   Socioeconomic History   Marital status: Married    Spouse name: Not on file   Number of children: Not on file   Years of education: Not on file   Highest education level: Not on file  Occupational History   Not on file  Tobacco Use   Smoking status: Never   Smokeless tobacco: Never  Vaping Use   Vaping Use: Never used  Substance and Sexual Activity   Alcohol use: Not Currently    Alcohol/week: 0.0 standard drinks of alcohol   Drug use: No   Sexual activity: Yes    Partners: Male    Birth control/protection: Surgical  Other Topics Concern   Not on file  Social History Narrative   Not on file   Social Determinants of Health   Financial Resource Strain: Not on file  Food Insecurity: Not on file  Transportation Needs: Not on file  Physical Activity: Not on file  Stress: Not on file  Social Connections: Not on file  Intimate Partner Violence: Not on file    Review of Systems: See HPI, otherwise negative ROS  Physical Exam: BP (!) 156/74   Pulse 85   Temp (!)  96.8 F (36 C) (Temporal)   Resp 18   Ht 5' 5"$  (1.651 m)   Wt  88.5 kg   SpO2 100%   BMI 32.45 kg/m  General:   Alert,  pleasant and cooperative in NAD Head:  Normocephalic and atraumatic. Neck:  Supple; no masses or thyromegaly. Lungs:  Clear throughout to auscultation.    Heart:  Regular rate and rhythm. Abdomen:  Soft, nontender and nondistended. Normal bowel sounds, without guarding, and without rebound.   Neurologic:  Alert and  oriented x4;  grossly normal neurologically.  Impression/Plan: Virginia Rogers is here for an colonoscopy to be performed for colon cancer screenings  Risks, benefits, limitations, and alternatives regarding  colonoscopy have been reviewed with the patient.  Questions have been answered.  All parties agreeable.   Sherri Sear, MD  03/19/2022, 10:26 AM

## 2022-03-21 NOTE — Progress Notes (Signed)
[employer]arry manager.  No Message Left.

## 2022-03-22 ENCOUNTER — Encounter: Payer: Self-pay | Admitting: Gastroenterology

## 2022-03-22 LAB — SURGICAL PATHOLOGY

## 2022-03-23 ENCOUNTER — Encounter: Payer: Self-pay | Admitting: Gastroenterology

## 2022-05-31 ENCOUNTER — Other Ambulatory Visit: Payer: Self-pay

## 2022-05-31 MED ORDER — LOSARTAN POTASSIUM 25 MG PO TABS
25.0000 mg | ORAL_TABLET | Freq: Every day | ORAL | 1 refills | Status: DC
  Filled 2022-05-31: qty 90, 90d supply, fill #0
  Filled 2022-08-19 (×2): qty 90, 90d supply, fill #1

## 2022-06-09 ENCOUNTER — Other Ambulatory Visit: Payer: Self-pay

## 2022-06-09 DIAGNOSIS — J4530 Mild persistent asthma, uncomplicated: Secondary | ICD-10-CM | POA: Diagnosis not present

## 2022-06-09 MED ORDER — BENZONATATE 100 MG PO CAPS
100.0000 mg | ORAL_CAPSULE | Freq: Three times a day (TID) | ORAL | 0 refills | Status: DC | PRN
  Filled 2022-06-09: qty 20, 7d supply, fill #0

## 2022-06-09 MED ORDER — METHYLPREDNISOLONE 4 MG PO TBPK
ORAL_TABLET | ORAL | 0 refills | Status: DC
  Filled 2022-06-09: qty 21, 6d supply, fill #0

## 2022-06-09 MED ORDER — BUDESONIDE-FORMOTEROL FUMARATE 160-4.5 MCG/ACT IN AERO
2.0000 | INHALATION_SPRAY | Freq: Two times a day (BID) | RESPIRATORY_TRACT | 2 refills | Status: DC
  Filled 2022-06-09: qty 10.2, 30d supply, fill #0

## 2022-07-07 ENCOUNTER — Other Ambulatory Visit: Payer: Self-pay

## 2022-07-21 DIAGNOSIS — E1169 Type 2 diabetes mellitus with other specified complication: Secondary | ICD-10-CM | POA: Diagnosis not present

## 2022-07-21 DIAGNOSIS — E119 Type 2 diabetes mellitus without complications: Secondary | ICD-10-CM | POA: Diagnosis not present

## 2022-07-21 DIAGNOSIS — E785 Hyperlipidemia, unspecified: Secondary | ICD-10-CM | POA: Diagnosis not present

## 2022-08-17 ENCOUNTER — Other Ambulatory Visit: Payer: Self-pay

## 2022-08-17 MED ORDER — NURTEC 75 MG PO TBDP
75.0000 mg | ORAL_TABLET | Freq: Every day | ORAL | 1 refills | Status: DC | PRN
  Filled 2022-08-17: qty 8, 16d supply, fill #0
  Filled 2022-08-23: qty 8, 30d supply, fill #0
  Filled 2022-09-28: qty 8, 30d supply, fill #1

## 2022-08-17 MED ORDER — ONDANSETRON 4 MG PO TBDP
4.0000 mg | ORAL_TABLET | Freq: Three times a day (TID) | ORAL | 0 refills | Status: DC | PRN
  Filled 2022-08-17: qty 30, 10d supply, fill #0

## 2022-08-18 ENCOUNTER — Other Ambulatory Visit: Payer: Self-pay | Admitting: Oncology

## 2022-08-18 DIAGNOSIS — Z006 Encounter for examination for normal comparison and control in clinical research program: Secondary | ICD-10-CM

## 2022-08-19 ENCOUNTER — Other Ambulatory Visit: Payer: Self-pay

## 2022-08-20 ENCOUNTER — Other Ambulatory Visit
Admission: RE | Admit: 2022-08-20 | Discharge: 2022-08-20 | Disposition: A | Discharge status: Home / Self Care | Payer: Commercial Managed Care - PPO | Source: Ambulatory Visit | Attending: Oncology | Admitting: Oncology

## 2022-08-20 DIAGNOSIS — Z006 Encounter for examination for normal comparison and control in clinical research program: Secondary | ICD-10-CM | POA: Insufficient documentation

## 2022-08-23 ENCOUNTER — Other Ambulatory Visit: Payer: Self-pay

## 2022-08-24 ENCOUNTER — Other Ambulatory Visit: Payer: Self-pay

## 2022-09-02 LAB — HELIX MOLECULAR SCREEN: Genetic Analysis Overall Interpretation: NEGATIVE

## 2022-10-29 ENCOUNTER — Other Ambulatory Visit: Payer: Self-pay

## 2022-10-29 DIAGNOSIS — E785 Hyperlipidemia, unspecified: Secondary | ICD-10-CM | POA: Diagnosis not present

## 2022-10-29 DIAGNOSIS — E1169 Type 2 diabetes mellitus with other specified complication: Secondary | ICD-10-CM | POA: Diagnosis not present

## 2022-10-29 MED ORDER — COVID-19 MRNA VAC-TRIS(PFIZER) 30 MCG/0.3ML IM SUSY
0.3000 mL | PREFILLED_SYRINGE | Freq: Once | INTRAMUSCULAR | 0 refills | Status: AC
  Filled 2022-10-29: qty 0.3, 1d supply, fill #0

## 2022-10-29 MED ORDER — FLULAVAL 0.5 ML IM SUSY
0.5000 mL | PREFILLED_SYRINGE | Freq: Once | INTRAMUSCULAR | 0 refills | Status: AC
  Filled 2022-10-29: qty 0.5, 1d supply, fill #0

## 2022-11-08 ENCOUNTER — Other Ambulatory Visit: Payer: Self-pay

## 2022-11-08 DIAGNOSIS — Z6834 Body mass index (BMI) 34.0-34.9, adult: Secondary | ICD-10-CM | POA: Diagnosis not present

## 2022-11-08 DIAGNOSIS — E6609 Other obesity due to excess calories: Secondary | ICD-10-CM | POA: Diagnosis not present

## 2022-11-08 DIAGNOSIS — Z0000 Encounter for general adult medical examination without abnormal findings: Secondary | ICD-10-CM | POA: Diagnosis not present

## 2022-11-08 DIAGNOSIS — E1169 Type 2 diabetes mellitus with other specified complication: Secondary | ICD-10-CM | POA: Diagnosis not present

## 2022-11-08 DIAGNOSIS — E785 Hyperlipidemia, unspecified: Secondary | ICD-10-CM | POA: Diagnosis not present

## 2022-11-08 MED ORDER — NYSTATIN 100000 UNIT/GM EX POWD
Freq: Two times a day (BID) | CUTANEOUS | 1 refills | Status: DC
  Filled 2022-11-08: qty 60, 30d supply, fill #0

## 2022-11-08 MED ORDER — LOSARTAN POTASSIUM 25 MG PO TABS
25.0000 mg | ORAL_TABLET | Freq: Every day | ORAL | 1 refills | Status: DC
  Filled 2022-11-08: qty 90, 90d supply, fill #0
  Filled 2023-02-16: qty 90, 90d supply, fill #1

## 2022-11-08 MED ORDER — ROSUVASTATIN CALCIUM 5 MG PO TABS
5.0000 mg | ORAL_TABLET | Freq: Every day | ORAL | 1 refills | Status: DC
  Filled 2022-11-08: qty 90, 90d supply, fill #0
  Filled 2023-02-16: qty 90, 90d supply, fill #1

## 2022-11-16 DIAGNOSIS — L538 Other specified erythematous conditions: Secondary | ICD-10-CM | POA: Diagnosis not present

## 2022-11-16 DIAGNOSIS — R208 Other disturbances of skin sensation: Secondary | ICD-10-CM | POA: Diagnosis not present

## 2022-11-16 DIAGNOSIS — D485 Neoplasm of uncertain behavior of skin: Secondary | ICD-10-CM | POA: Diagnosis not present

## 2022-11-16 DIAGNOSIS — L820 Inflamed seborrheic keratosis: Secondary | ICD-10-CM | POA: Diagnosis not present

## 2022-11-16 DIAGNOSIS — L821 Other seborrheic keratosis: Secondary | ICD-10-CM | POA: Diagnosis not present

## 2022-11-18 ENCOUNTER — Other Ambulatory Visit: Payer: Self-pay

## 2022-12-03 ENCOUNTER — Other Ambulatory Visit: Payer: Self-pay

## 2022-12-03 MED ORDER — BOOSTRIX 5-2.5-18.5 LF-MCG/0.5 IM SUSY
PREFILLED_SYRINGE | INTRAMUSCULAR | 0 refills | Status: DC
  Filled 2022-12-03: qty 0.5, 1d supply, fill #0

## 2022-12-08 ENCOUNTER — Other Ambulatory Visit: Payer: Self-pay

## 2022-12-08 DIAGNOSIS — J4520 Mild intermittent asthma, uncomplicated: Secondary | ICD-10-CM | POA: Diagnosis not present

## 2022-12-08 DIAGNOSIS — J209 Acute bronchitis, unspecified: Secondary | ICD-10-CM | POA: Diagnosis not present

## 2022-12-08 MED ORDER — METHYLPREDNISOLONE 4 MG PO TBPK
ORAL_TABLET | ORAL | 0 refills | Status: AC
  Filled 2022-12-08: qty 21, 6d supply, fill #0

## 2022-12-08 MED ORDER — DOXYCYCLINE HYCLATE 100 MG PO CAPS
100.0000 mg | ORAL_CAPSULE | Freq: Two times a day (BID) | ORAL | 0 refills | Status: DC
  Filled 2022-12-08: qty 14, 7d supply, fill #0

## 2022-12-16 ENCOUNTER — Other Ambulatory Visit: Payer: Self-pay

## 2022-12-16 MED ORDER — JARDIANCE 25 MG PO TABS
25.0000 mg | ORAL_TABLET | Freq: Every day | ORAL | 1 refills | Status: DC
  Filled 2022-12-16: qty 90, 90d supply, fill #0
  Filled 2023-03-16: qty 90, 90d supply, fill #1

## 2023-01-19 ENCOUNTER — Other Ambulatory Visit: Payer: Self-pay

## 2023-01-19 DIAGNOSIS — E1169 Type 2 diabetes mellitus with other specified complication: Secondary | ICD-10-CM | POA: Diagnosis not present

## 2023-01-19 DIAGNOSIS — E119 Type 2 diabetes mellitus without complications: Secondary | ICD-10-CM | POA: Diagnosis not present

## 2023-01-19 DIAGNOSIS — E785 Hyperlipidemia, unspecified: Secondary | ICD-10-CM | POA: Diagnosis not present

## 2023-01-19 MED ORDER — MOUNJARO 10 MG/0.5ML ~~LOC~~ SOAJ
10.0000 mg | SUBCUTANEOUS | 4 refills | Status: DC
  Filled 2023-01-19 – 2023-02-16 (×2): qty 6, 84d supply, fill #0
  Filled 2023-05-08: qty 6, 84d supply, fill #1
  Filled 2023-07-29: qty 6, 84d supply, fill #2

## 2023-02-16 ENCOUNTER — Other Ambulatory Visit: Payer: Self-pay

## 2023-02-18 ENCOUNTER — Other Ambulatory Visit: Payer: Self-pay

## 2023-02-21 ENCOUNTER — Other Ambulatory Visit: Payer: Self-pay

## 2023-02-21 MED ORDER — RIMEGEPANT SULFATE 75 MG PO TBDP
75.0000 mg | ORAL_TABLET | Freq: Every day | ORAL | 1 refills | Status: DC | PRN
  Filled 2023-02-21: qty 8, 30d supply, fill #0
  Filled 2023-07-05: qty 8, 30d supply, fill #1

## 2023-03-16 ENCOUNTER — Other Ambulatory Visit: Payer: Self-pay

## 2023-05-02 DIAGNOSIS — E1169 Type 2 diabetes mellitus with other specified complication: Secondary | ICD-10-CM | POA: Diagnosis not present

## 2023-05-02 DIAGNOSIS — E785 Hyperlipidemia, unspecified: Secondary | ICD-10-CM | POA: Diagnosis not present

## 2023-05-06 ENCOUNTER — Other Ambulatory Visit: Payer: Self-pay

## 2023-05-06 DIAGNOSIS — E1169 Type 2 diabetes mellitus with other specified complication: Secondary | ICD-10-CM | POA: Diagnosis not present

## 2023-05-06 DIAGNOSIS — Z6835 Body mass index (BMI) 35.0-35.9, adult: Secondary | ICD-10-CM | POA: Diagnosis not present

## 2023-05-06 DIAGNOSIS — E785 Hyperlipidemia, unspecified: Secondary | ICD-10-CM | POA: Diagnosis not present

## 2023-05-06 DIAGNOSIS — M79642 Pain in left hand: Secondary | ICD-10-CM | POA: Diagnosis not present

## 2023-05-06 DIAGNOSIS — M79641 Pain in right hand: Secondary | ICD-10-CM | POA: Diagnosis not present

## 2023-05-06 DIAGNOSIS — E66812 Obesity, class 2: Secondary | ICD-10-CM | POA: Diagnosis not present

## 2023-05-06 DIAGNOSIS — G43009 Migraine without aura, not intractable, without status migrainosus: Secondary | ICD-10-CM | POA: Diagnosis not present

## 2023-05-06 MED ORDER — NURTEC 75 MG PO TBDP
75.0000 mg | ORAL_TABLET | Freq: Every day | ORAL | 1 refills | Status: DC | PRN
Start: 2023-05-06 — End: 2023-10-21
  Filled 2023-05-06: qty 8, 8d supply, fill #0

## 2023-05-06 MED ORDER — ROSUVASTATIN CALCIUM 5 MG PO TABS
5.0000 mg | ORAL_TABLET | Freq: Every day | ORAL | 1 refills | Status: DC
  Filled 2023-05-06: qty 90, 90d supply, fill #0
  Filled 2023-08-22: qty 90, 90d supply, fill #1
  Filled 2023-11-17: qty 90, 90d supply, fill #2

## 2023-05-06 MED ORDER — LOSARTAN POTASSIUM 25 MG PO TABS
25.0000 mg | ORAL_TABLET | Freq: Every day | ORAL | 1 refills | Status: DC
  Filled 2023-05-06: qty 90, 90d supply, fill #0
  Filled 2023-08-22: qty 90, 90d supply, fill #1
  Filled 2023-11-17: qty 90, 90d supply, fill #2

## 2023-05-06 MED ORDER — ONDANSETRON 4 MG PO TBDP
4.0000 mg | ORAL_TABLET | Freq: Three times a day (TID) | ORAL | 0 refills | Status: AC | PRN
  Filled 2023-05-06: qty 30, 10d supply, fill #0

## 2023-05-06 MED ORDER — EPINEPHRINE 0.3 MG/0.3ML IJ SOAJ
0.3000 mg | INTRAMUSCULAR | 1 refills | Status: AC | PRN
  Filled 2023-05-06: qty 2, 2d supply, fill #0

## 2023-06-01 ENCOUNTER — Other Ambulatory Visit: Payer: Self-pay

## 2023-06-08 ENCOUNTER — Other Ambulatory Visit: Payer: Self-pay

## 2023-06-08 DIAGNOSIS — E66812 Obesity, class 2: Secondary | ICD-10-CM | POA: Diagnosis not present

## 2023-06-08 DIAGNOSIS — J4520 Mild intermittent asthma, uncomplicated: Secondary | ICD-10-CM | POA: Diagnosis not present

## 2023-06-08 DIAGNOSIS — Z6835 Body mass index (BMI) 35.0-35.9, adult: Secondary | ICD-10-CM | POA: Diagnosis not present

## 2023-06-08 MED ORDER — ALBUTEROL SULFATE HFA 108 (90 BASE) MCG/ACT IN AERS
2.0000 | INHALATION_SPRAY | Freq: Four times a day (QID) | RESPIRATORY_TRACT | 1 refills | Status: DC | PRN
  Filled 2023-06-08: qty 18, 25d supply, fill #0

## 2023-06-08 MED ORDER — MONTELUKAST SODIUM 10 MG PO TABS
10.0000 mg | ORAL_TABLET | Freq: Every day | ORAL | 5 refills | Status: AC
  Filled 2023-06-08: qty 30, 30d supply, fill #0
  Filled 2023-11-22: qty 30, 30d supply, fill #1

## 2023-06-08 MED ORDER — BUDESONIDE-FORMOTEROL FUMARATE 80-4.5 MCG/ACT IN AERO
2.0000 | INHALATION_SPRAY | Freq: Two times a day (BID) | RESPIRATORY_TRACT | 5 refills | Status: AC
  Filled 2023-06-08: qty 10.2, 30d supply, fill #0
  Filled 2023-11-22: qty 10.2, 30d supply, fill #1

## 2023-07-05 ENCOUNTER — Other Ambulatory Visit: Payer: Self-pay

## 2023-07-05 MED ORDER — EMPAGLIFLOZIN 25 MG PO TABS
25.0000 mg | ORAL_TABLET | Freq: Every day | ORAL | 1 refills | Status: DC
  Filled 2023-07-05: qty 90, 90d supply, fill #0
  Filled 2023-09-29: qty 90, 90d supply, fill #1

## 2023-07-28 DIAGNOSIS — E1169 Type 2 diabetes mellitus with other specified complication: Secondary | ICD-10-CM | POA: Diagnosis not present

## 2023-07-28 DIAGNOSIS — E119 Type 2 diabetes mellitus without complications: Secondary | ICD-10-CM | POA: Diagnosis not present

## 2023-07-28 DIAGNOSIS — E785 Hyperlipidemia, unspecified: Secondary | ICD-10-CM | POA: Diagnosis not present

## 2023-07-29 ENCOUNTER — Other Ambulatory Visit: Payer: Self-pay

## 2023-08-22 ENCOUNTER — Other Ambulatory Visit: Payer: Self-pay

## 2023-09-29 ENCOUNTER — Other Ambulatory Visit: Payer: Self-pay

## 2023-10-20 DIAGNOSIS — R079 Chest pain, unspecified: Secondary | ICD-10-CM | POA: Insufficient documentation

## 2023-10-20 NOTE — Progress Notes (Unsigned)
 Cardiology Office Note  Date:  10/21/2023   ID:  Virginia Rogers, DOB 02-Oct-1960, MRN [id number]  PCP:  Alla Amis, MD   Chief Complaint  Patient presents with   New Patient (Initial Visit)    Ref by Dr. Alla for chest pain. Patient c/o two spells over the past two weeks of chest pain/burning sensation that radiated to her right arm with shortness of breath and heaviness; symptoms lasted for 20 minutes.     HPI:  Virginia Rogers is a 64 y.o. femalewith past medical history of: Diabetes type 2, followed by endocrinology Mild asthma Hyperlipidemia Migraines GERD Who presents by referral from Dr. Alla for consultation of her chest pain  On discussion today, she reports having two episodes of chest pain, feels like a burning sensation on the right side of her chest down the right arm Symptoms presented at rest First episode lasting 20 minutes, second episode was 2 days ago lasting 5 minutes Resolved without intervention  Reports she is tolerating reduced dose Mounjaro  Weight down 37 pounds Active at baseline  Lab work reviewed A1c 6.2 Total cholesterol 146 LDL 75  EKG personally reviewed by myself on todays visit EKG Interpretation Date/Time:  Friday October 21 2023 10:54:24 EDT Ventricular Rate:  88 PR Interval:  130 QRS Duration:  86 QT Interval:  360 QTC Calculation: 435 R Axis:   0  Text Interpretation: Normal sinus rhythm Normal ECG When compared with ECG of 14-Aug-2019 07:40, No significant change was found Confirmed by Perla Lye [phone]) on 10/21/2023 10:56:02 AM    Family history Father, smoker died Vtach arrest 32,  brother died 65s   PMH:   has a past medical history of Allergy, Asthma, Complication of anesthesia, Diabetes mellitus without complication (HCC), GERD (gastroesophageal reflux disease), H/O scarlet fever (1976), Headache, Heart murmur, and Pure hypercholesterolemia (07/28/2015).  PSH:    Past Surgical History:   Procedure Laterality Date   ABDOMINAL HYSTERECTOMY  2009   AUGMENTATION MAMMAPLASTY Bilateral 1996   BREAST BIOPSY Left 09/15/2015   hyalinized fibroadenoma, fibrocystic changes   BREAST BIOPSY Right 07/16/2019   ribbon clip, stereo bx, neg   BREAST BIOPSY Right 02/24/2022   coil clip, stereo bx, calcs, path pending.   BREAST BIOPSY Right 02/24/2022   MM RT BREAST BX W LOC DEV 1ST LESION IMAGE BX SPEC STEREO GUIDE 02/24/2022 [facility]   BREAST BIOPSY WITH RADIO FREQUENCY LOCALIZER Right 08/17/2019   Procedure: BREAST BIOPSY WITH RADIO FREQUENCY LOCALIZER;  Surgeon: Rodolph Romano, MD;  Location: [facility];  Service: General;  Laterality: Right;   BREAST CYST ASPIRATION Right 2003   BREAST ENHANCEMENT SURGERY Bilateral 1996   BREAST EXCISIONAL BIOPSY Right 1997   neg   BREAST EXCISIONAL BIOPSY Left 04/15/2016   Needle Placement   BREAST LUMPECTOMY WITH NEEDLE LOCALIZATION Left 04/15/2016   Procedure: BREAST LUMPECTOMY WITH NEEDLE LOCALIZATION;  Surgeon: Larinda Unknown Sharps, MD;  Location: [facility];  Service: General;  Laterality: Left;   CESAREAN SECTION  1983   COLONOSCOPY WITH PROPOFOL  N/A 03/15/2017   Procedure: COLONOSCOPY WITH PROPOFOL ;  Surgeon: Janalyn Keene NOVAK, MD;  Location: [facility];  Service: Endoscopy;  Laterality: N/A;   COLONOSCOPY WITH PROPOFOL  N/A 03/19/2022   Procedure: COLONOSCOPY WITH PROPOFOL ;  Surgeon: Unk Corinn Skiff, MD;  Location: [facility];  Service: Gastroenterology;  Laterality: N/A;   dislocated right shoulder Right 2007   block to put shoulder back in place   OVARIAN CYST REMOVAL  2009   ROTATOR CUFF  REPAIR Bilateral [id number]   TONSILLECTOMY AND ADENOIDECTOMY  2008   UUUP   tummy tuck  11/2018   WISDOM TOOTH EXTRACTION      Current Outpatient Medications  Medication Sig Dispense Refill   ACCU-CHEK FASTCLIX LANCETS MISC   5   ACCU-CHEK GUIDE test strip   11   budesonide -formoterol  (SYMBICORT ) 80-4.5 MCG/ACT inhaler  Inhale 2 puffs into the lungs 2 (two) times daily. 10.2 g 5   empagliflozin  (JARDIANCE ) 25 MG TABS tablet Take 1 tablet (25 mg total) by mouth daily. 90 tablet 1   loratadine (CLARITIN) 10 MG tablet Take 10 mg by mouth at bedtime.     losartan  (COZAAR ) 25 MG tablet Take 1 tablet (25 mg total) by mouth once daily 100 tablet 1   montelukast  (SINGULAIR ) 10 MG tablet Take 1 tablet (10 mg total) by mouth at bedtime 30 tablet 5   rosuvastatin  (CRESTOR ) 5 MG tablet Take 1 tablet (5 mg total) by mouth daily. 100 tablet 1   tirzepatide  (MOUNJARO ) 10 MG/0.5ML Pen Inject 10 mg subcutaneously every 7 (seven) days 2 mL 12   COVID-19 mRNA Vacc, Moderna, (MNEXSPIKE) 10 MCG/0.2ML SUSY Inject 0.2 mLs into the muscle once for 1 dose Can adjust vaccine brand based on stock. (Patient not taking: Reported on 10/21/2023) 0.2 mL 0   EPINEPHrine  0.3 mg/0.3 mL IJ SOAJ injection Inject 0.3 mLs (0.3 mg total) into the muscle as needed for Anaphylaxis 2 each 1   ondansetron  (ZOFRAN -ODT) 4 MG disintegrating tablet Take 1 tablet (4 mg total) by mouth every 8 (eight) hours as needed for Nausea or Vomiting (Patient not taking: Reported on 10/21/2023) 30 tablet 0   triamcinolone  cream (KENALOG ) 0.5 % Apply topically 2 (two) times daily Apply 2 x per day (up to 7-10 days) (Patient not taking: Reported on 10/21/2023) 30 g 0   No current facility-administered medications for this visit.     Allergies:   Other, Penicillins, Saxagliptin, Sulfamethoxazole-trimethoprim, Vancomycin, Propranolol, Metformin and related, Pravastatin, Monascus purpureus went yeast, and Red yeast rice  [cholestin]   Social History:  The patient  reports that she has never smoked. She has never used smokeless tobacco. She reports that she does not currently use alcohol. She reports that she does not use drugs.   Family History:   family history includes Arthritis in her mother; Breast cancer (age of onset: 90) in her sister; Cancer in her maternal uncle; Colon  cancer (age of onset: 3) in her maternal grandmother; Diabetes in her mother; Heart disease in her brother and father.    Review of Systems: Review of Systems  Constitutional: Negative.   HENT: Negative.    Respiratory: Negative.    Cardiovascular:  Positive for chest pain.  Gastrointestinal: Negative.   Musculoskeletal: Negative.   Neurological: Negative.   Psychiatric/Behavioral: Negative.    All other systems reviewed and are negative.  PHYSICAL EXAM: VS:  BP 128/70 (BP Location: Right Arm, Patient Position: Sitting, Cuff Size: Large)   Pulse 88   Ht 5' 4 (1.626 m)   Wt 208 lb (94.3 kg)   SpO2 96%   BMI 35.70 kg/m  , BMI Body mass index is 35.7 kg/m. GEN: Well nourished, well developed, in no acute distress HEENT: normal Neck: no JVD, carotid bruits, or masses Cardiac: RRR; no murmurs, rubs, or gallops,no edema  Respiratory:  clear to auscultation bilaterally, normal work of breathing GI: soft, nontender, nondistended, + BS MS: no deformity or atrophy Skin: warm and dry, no rash  Neuro:  Strength and sensation are intact Psych: euthymic mood, full affect   Recent Labs: No results found for requested labs within last 365 days.    Lipid Panel No results found for: CHOL, HDL, LDLCALC, TRIG    Wt Readings from Last 3 Encounters:  10/21/23 208 lb (94.3 kg)  03/19/22 195 lb (88.5 kg)  06/29/21 222 lb 7.1 oz (100.9 kg)       ASSESSMENT AND PLAN:  Problem List Items Addressed This Visit       Cardiology Problems   Pure hypercholesterolemia     Other   Chest pain of uncertain etiology - Primary   Relevant Orders   EKG 12-Lead (Completed)   CT CARDIAC SCORING (SELF PAY ONLY)   Controlled type 2 diabetes mellitus without complication (HCC)   Airway hyperreactivity   Other Visit Diagnoses       Essential hypertension          Atypical chest pain 2 episodes of chest discomfort on the right side with some extension into the arm, first episode  20 minutes, second episode 5 minutes Resolved without intervention, presenting at rest  Risk factors include diabetes, hyperlipidemia, family history Risk stratification recommended with CT calcium  scoring -Current non-smoker, cholesterol and diabetes numbers well-controlled  Hyperlipidemia On Crestor  5 mg daily with reasonable numbers For elevated calcium  score could increase Crestor  dosing or add Zetia  Diabetes type 2 Followed by endocrinology, A1c well-controlled  Essential hypertension Blood pressure is well controlled on today's visit. No changes made to the medications.  Ms. Mclin was seen in consultation for Dr.Linthavong and will be referred back to his office for ongoing care of the issues detailed above  Signed, Velinda Lunger, M.D., Ph.D. [facility] [place], [place] [phone]

## 2023-10-21 ENCOUNTER — Encounter: Payer: Self-pay | Admitting: Cardiovascular Disease

## 2023-10-21 ENCOUNTER — Other Ambulatory Visit: Payer: Self-pay

## 2023-10-21 ENCOUNTER — Ambulatory Visit: Attending: Cardiovascular Disease | Admitting: Cardiovascular Disease

## 2023-10-21 VITALS — BP 128/70 | HR 88 | Ht 64.0 in | Wt 208.0 lb

## 2023-10-21 DIAGNOSIS — E119 Type 2 diabetes mellitus without complications: Secondary | ICD-10-CM | POA: Diagnosis not present

## 2023-10-21 DIAGNOSIS — I10 Essential (primary) hypertension: Secondary | ICD-10-CM | POA: Diagnosis not present

## 2023-10-21 DIAGNOSIS — R079 Chest pain, unspecified: Secondary | ICD-10-CM | POA: Diagnosis not present

## 2023-10-21 DIAGNOSIS — E7800 Pure hypercholesterolemia, unspecified: Secondary | ICD-10-CM

## 2023-10-21 DIAGNOSIS — J4520 Mild intermittent asthma, uncomplicated: Secondary | ICD-10-CM | POA: Diagnosis not present

## 2023-10-21 MED ORDER — COMIRNATY 30 MCG/0.3ML IM SUSY
0.3000 mL | PREFILLED_SYRINGE | Freq: Once | INTRAMUSCULAR | 0 refills | Status: AC
  Filled 2023-10-21: qty 0.3, 1d supply, fill #0

## 2023-10-21 NOTE — Patient Instructions (Addendum)
Medication Instructions:  No changes  If you need a refill on your cardiac medications before your next appointment, please call your pharmacy.   Lab work: No new labs needed  Testing/Procedures: CT coronary calcium score.   - $99 out of pocket cost at the time of your test - Call [phone] to schedule at your convenience.  [facility] [address] , [place] [address]   Coronary CalciumScan A coronary calcium scan is an imaging test used to look for deposits of calcium and other fatty materials (plaques) in the inner lining of the blood vessels of the heart (coronary arteries). These deposits of calcium and plaques can partly clog and narrow the coronary arteries without producing any symptoms or warning signs. This puts a person at risk for a heart attack. This test can detect these deposits before symptoms develop. Tell a health care provider about: Any allergies you have. All medicines you are taking, including vitamins, herbs, eye drops, creams, and over-the-counter medicines. Any problems you or family members have had with anesthetic medicines. Any blood disorders you have. Any surgeries you have had. Any medical conditions you have. Whether you are pregnant or may be pregnant. What are the risks? Generally, this is a safe procedure. However, problems may occur, including: Harm to a pregnant woman and her unborn baby. This test involves the use of radiation. Radiation exposure can be dangerous to a pregnant woman and her unborn baby. If you are pregnant, you generally should not have this procedure done. Slight increase in the risk of cancer. This is because of the radiation involved in the test. What happens before the procedure? No preparation is needed for this procedure. What happens during the procedure? You will undress and remove any jewelry around your neck or chest. You will put on a hospital gown. Sticky  electrodes will be placed on your chest. The electrodes will be connected to an electrocardiogram (ECG) machine to record a tracing of the electrical activity of your heart. A CT scanner will take pictures of your heart. During this time, you will be asked to lie still and hold your breath for 2-3 seconds while a picture of your heart is being taken. The procedure may vary among health care providers and hospitals. What happens after the procedure? You can get dressed. You can return to your normal activities. It is up to you to get the results of your test. Ask your health care provider, or the department that is doing the test, when your results will be ready. Summary A coronary calcium scan is an imaging test used to look for deposits of calcium and other fatty materials (plaques) in the inner lining of the blood vessels of the heart (coronary arteries). Generally, this is a safe procedure. Tell your health care provider if you are pregnant or may be pregnant. No preparation is needed for this procedure. A CT scanner will take pictures of your heart. You can return to your normal activities after the scan is done. This information is not intended to replace advice given to you by your health care provider. Make sure you discuss any questions you have with your health care provider. Document Released: 07/24/2007 Document Revised: 12/15/2015 Document Reviewed: 12/15/2015 Elsevier Interactive Patient Education  2017 Elsevier Inc.  Follow-Up: At [facility], you and your health needs are our priority.  As part of our continuing mission to provide you with exceptional heart care, we have created designated Provider Care   Teams.  These Care Teams include your primary Cardiologist (physician) and Advanced Practice Providers (APPs -  Physician Assistants and Nurse Practitioners) who all work together to provide you with the care you need, when you need it.  You will need a follow up appointment as  needed  Providers on your designated Care Team:   Christopher Berge, NP Ryan Dunn, PA-C Cadence Furth, PA-C  COVID-19 Vaccine Information can be found at: [email or web address] For questions related to vaccine distribution or appointments, please email [email or web address] or call [phone].   

## 2023-10-24 ENCOUNTER — Telehealth: Payer: Self-pay | Admitting: Cardiovascular Disease

## 2023-10-24 ENCOUNTER — Other Ambulatory Visit: Payer: Self-pay

## 2023-10-24 MED ORDER — MOUNJARO 10 MG/0.5ML ~~LOC~~ SOAJ
10.0000 mg | SUBCUTANEOUS | 4 refills | Status: AC
  Filled 2023-10-24: qty 6, 84d supply, fill #0
  Filled 2023-12-20 – 2024-01-03 (×3): qty 6, 84d supply, fill #1

## 2023-10-24 NOTE — Telephone Encounter (Signed)
 Called and spoke with patient. Patient is scheduled at [facility] for CT.

## 2023-10-24 NOTE — Telephone Encounter (Signed)
 Patient came by Patient states that she wants to have Calcium  Score done at the hospital not [facility] so she would like an order placed for that Please call to discuss whether or not this is possible

## 2023-10-28 ENCOUNTER — Ambulatory Visit
Admission: RE | Admit: 2023-10-28 | Discharge: 2023-10-28 | Disposition: A | Discharge status: Home / Self Care | Payer: Self-pay | Source: Ambulatory Visit | Attending: Cardiovascular Disease | Admitting: Cardiovascular Disease

## 2023-10-28 DIAGNOSIS — R079 Chest pain, unspecified: Secondary | ICD-10-CM | POA: Insufficient documentation

## 2023-11-02 DIAGNOSIS — Z0000 Encounter for general adult medical examination without abnormal findings: Secondary | ICD-10-CM | POA: Diagnosis not present

## 2023-11-04 ENCOUNTER — Ambulatory Visit: Payer: Self-pay | Admitting: Cardiovascular Disease

## 2023-11-09 DIAGNOSIS — Z6835 Body mass index (BMI) 35.0-35.9, adult: Secondary | ICD-10-CM | POA: Diagnosis not present

## 2023-11-09 DIAGNOSIS — E1169 Type 2 diabetes mellitus with other specified complication: Secondary | ICD-10-CM | POA: Diagnosis not present

## 2023-11-09 DIAGNOSIS — E66812 Obesity, class 2: Secondary | ICD-10-CM | POA: Diagnosis not present

## 2023-11-09 DIAGNOSIS — Z0000 Encounter for general adult medical examination without abnormal findings: Secondary | ICD-10-CM | POA: Diagnosis not present

## 2023-11-09 DIAGNOSIS — E785 Hyperlipidemia, unspecified: Secondary | ICD-10-CM | POA: Diagnosis not present

## 2023-11-09 DIAGNOSIS — Z1331 Encounter for screening for depression: Secondary | ICD-10-CM | POA: Diagnosis not present

## 2023-11-17 ENCOUNTER — Other Ambulatory Visit: Payer: Self-pay

## 2023-11-18 ENCOUNTER — Other Ambulatory Visit: Payer: Self-pay

## 2023-11-18 MED ORDER — LOSARTAN POTASSIUM 25 MG PO TABS
25.0000 mg | ORAL_TABLET | Freq: Every day | ORAL | 1 refills | Status: AC
  Filled 2023-11-18: qty 90, 90d supply, fill #0
  Filled 2024-02-15: qty 90, 90d supply, fill #1

## 2023-11-18 MED ORDER — ROSUVASTATIN CALCIUM 5 MG PO TABS
5.0000 mg | ORAL_TABLET | Freq: Every day | ORAL | 1 refills | Status: AC
  Filled 2023-11-18: qty 90, 90d supply, fill #0
  Filled 2024-02-15: qty 90, 90d supply, fill #1

## 2023-11-21 ENCOUNTER — Ambulatory Visit: Admitting: Cardiovascular Disease

## 2023-11-22 ENCOUNTER — Other Ambulatory Visit: Payer: Self-pay

## 2023-11-23 ENCOUNTER — Other Ambulatory Visit: Payer: Self-pay

## 2023-12-08 ENCOUNTER — Other Ambulatory Visit: Payer: Self-pay

## 2023-12-08 DIAGNOSIS — K219 Gastro-esophageal reflux disease without esophagitis: Secondary | ICD-10-CM | POA: Diagnosis not present

## 2023-12-08 DIAGNOSIS — Z6835 Body mass index (BMI) 35.0-35.9, adult: Secondary | ICD-10-CM | POA: Diagnosis not present

## 2023-12-08 DIAGNOSIS — J4520 Mild intermittent asthma, uncomplicated: Secondary | ICD-10-CM | POA: Diagnosis not present

## 2023-12-08 DIAGNOSIS — E66812 Obesity, class 2: Secondary | ICD-10-CM | POA: Diagnosis not present

## 2023-12-08 MED ORDER — ALBUTEROL SULFATE HFA 108 (90 BASE) MCG/ACT IN AERS
2.0000 | INHALATION_SPRAY | Freq: Four times a day (QID) | RESPIRATORY_TRACT | 1 refills | Status: AC | PRN
  Filled 2023-12-08: qty 18, 25d supply, fill #0

## 2023-12-08 MED ORDER — METHYLPREDNISOLONE 4 MG PO TBPK
ORAL_TABLET | ORAL | 0 refills | Status: AC
  Filled 2023-12-08: qty 21, 6d supply, fill #0

## 2023-12-20 ENCOUNTER — Other Ambulatory Visit: Payer: Self-pay

## 2024-01-02 ENCOUNTER — Other Ambulatory Visit: Payer: Self-pay

## 2024-01-03 ENCOUNTER — Other Ambulatory Visit: Payer: Self-pay

## 2024-01-17 ENCOUNTER — Other Ambulatory Visit: Payer: Self-pay

## 2024-01-17 MED ORDER — EMPAGLIFLOZIN 25 MG PO TABS
25.0000 mg | ORAL_TABLET | Freq: Every day | ORAL | 1 refills | Status: AC
  Filled 2024-01-17: qty 90, 90d supply, fill #0

## 2024-01-21 DIAGNOSIS — E119 Type 2 diabetes mellitus without complications: Secondary | ICD-10-CM | POA: Diagnosis not present

## 2024-01-21 DIAGNOSIS — H524 Presbyopia: Secondary | ICD-10-CM | POA: Diagnosis not present

## 2024-01-31 ENCOUNTER — Other Ambulatory Visit: Payer: Self-pay

## 2024-01-31 MED ORDER — RIMEGEPANT SULFATE 75 MG PO TBDP
75.0000 mg | ORAL_TABLET | Freq: Every day | ORAL | 1 refills | Status: AC | PRN
  Filled 2024-01-31: qty 8, 30d supply, fill #0
